# Patient Record
Sex: Female | Born: 1965 | Race: White | Hispanic: No | Marital: Married | Smoking: Current every day smoker
Health system: Southern US, Community
[De-identification: ages and names within clinical notes are randomized; demographics above are authoritative.]

## PROBLEM LIST (undated history)

## (undated) DIAGNOSIS — I10 Essential (primary) hypertension: Secondary | ICD-10-CM

## (undated) HISTORY — PX: TONSILLECTOMY: SUR1361

## (undated) HISTORY — PX: ABDOMINAL HYSTERECTOMY: SHX81

## (undated) HISTORY — PX: BREAST BIOPSY: SHX20

---

## 2003-11-26 HISTORY — PX: BREAST EXCISIONAL BIOPSY: SUR124

## 2005-05-20 ENCOUNTER — Emergency Department: Payer: Self-pay | Admitting: Emergency Medicine

## 2005-05-20 ENCOUNTER — Other Ambulatory Visit: Payer: Self-pay

## 2007-09-12 ENCOUNTER — Emergency Department: Payer: Self-pay | Admitting: Emergency Medicine

## 2009-05-25 ENCOUNTER — Emergency Department: Payer: Self-pay | Admitting: Emergency Medicine

## 2009-05-26 ENCOUNTER — Emergency Department: Payer: Self-pay | Admitting: Internal Medicine

## 2010-04-30 ENCOUNTER — Emergency Department: Payer: Self-pay | Admitting: Emergency Medicine

## 2010-05-01 ENCOUNTER — Emergency Department: Payer: Self-pay | Admitting: Emergency Medicine

## 2011-01-09 ENCOUNTER — Emergency Department: Payer: Self-pay | Admitting: Emergency Medicine

## 2013-02-22 ENCOUNTER — Ambulatory Visit: Payer: Self-pay | Admitting: Emergency Medicine

## 2013-02-22 LAB — URINALYSIS, COMPLETE
Bilirubin,UR: NEGATIVE
Glucose,UR: NEGATIVE mg/dL (ref 0–75)
Ketone: NEGATIVE
Protein: NEGATIVE
Specific Gravity: 1.02 (ref 1.003–1.030)

## 2014-01-06 ENCOUNTER — Ambulatory Visit: Payer: Self-pay

## 2014-02-10 ENCOUNTER — Ambulatory Visit: Payer: Self-pay

## 2014-02-10 LAB — RAPID INFLUENZA A&B ANTIGENS (ARMC ONLY)

## 2015-03-27 ENCOUNTER — Other Ambulatory Visit: Payer: Self-pay

## 2015-03-27 DIAGNOSIS — Z1231 Encounter for screening mammogram for malignant neoplasm of breast: Secondary | ICD-10-CM

## 2015-04-03 ENCOUNTER — Other Ambulatory Visit: Payer: Self-pay

## 2015-04-13 ENCOUNTER — Ambulatory Visit
Admission: RE | Admit: 2015-04-13 | Discharge: 2015-04-13 | Disposition: A | Discharge status: Home / Self Care | Payer: No Typology Code available for payment source | Source: Ambulatory Visit

## 2015-04-13 DIAGNOSIS — Z1231 Encounter for screening mammogram for malignant neoplasm of breast: Secondary | ICD-10-CM | POA: Insufficient documentation

## 2015-06-22 ENCOUNTER — Emergency Department
Admission: EM | Admit: 2015-06-22 | Discharge: 2015-06-22 | Disposition: A | Discharge status: Home / Self Care | Payer: No Typology Code available for payment source | Attending: Emergency Medicine | Admitting: Emergency Medicine

## 2015-06-22 ENCOUNTER — Encounter: Payer: Self-pay | Admitting: *Deleted

## 2015-06-22 DIAGNOSIS — R110 Nausea: Secondary | ICD-10-CM | POA: Diagnosis not present

## 2015-06-22 DIAGNOSIS — Z720 Tobacco use: Secondary | ICD-10-CM | POA: Insufficient documentation

## 2015-06-22 DIAGNOSIS — I10 Essential (primary) hypertension: Secondary | ICD-10-CM | POA: Diagnosis not present

## 2015-06-22 DIAGNOSIS — R197 Diarrhea, unspecified: Secondary | ICD-10-CM | POA: Insufficient documentation

## 2015-06-22 LAB — CBC
HCT: 44.7 % (ref 35.0–47.0)
HEMOGLOBIN: 15 g/dL (ref 12.0–16.0)
MCH: 30.3 pg (ref 26.0–34.0)
MCHC: 33.6 g/dL (ref 32.0–36.0)
MCV: 90.2 fL (ref 80.0–100.0)
PLATELETS: 214 10*3/uL (ref 150–440)
RBC: 4.95 MIL/uL (ref 3.80–5.20)
RDW: 13.3 % (ref 11.5–14.5)
WBC: 7.5 10*3/uL (ref 3.6–11.0)

## 2015-06-22 LAB — URINALYSIS COMPLETE WITH MICROSCOPIC (ARMC ONLY)
BACTERIA UA: NONE SEEN
BILIRUBIN URINE: NEGATIVE
Glucose, UA: NEGATIVE mg/dL
HGB URINE DIPSTICK: NEGATIVE
Ketones, ur: NEGATIVE mg/dL
Leukocytes, UA: NEGATIVE
Nitrite: NEGATIVE
PROTEIN: NEGATIVE mg/dL
SPECIFIC GRAVITY, URINE: 1.021 (ref 1.005–1.030)
pH: 5 (ref 5.0–8.0)

## 2015-06-22 LAB — COMPREHENSIVE METABOLIC PANEL
ALK PHOS: 90 U/L (ref 38–126)
ALT: 14 U/L (ref 14–54)
ANION GAP: 9 (ref 5–15)
AST: 25 U/L (ref 15–41)
Albumin: 4.4 g/dL (ref 3.5–5.0)
BUN: 18 mg/dL (ref 6–20)
CO2: 26 mmol/L (ref 22–32)
Calcium: 8.8 mg/dL — ABNORMAL LOW (ref 8.9–10.3)
Chloride: 104 mmol/L (ref 101–111)
Creatinine, Ser: 0.46 mg/dL (ref 0.44–1.00)
GFR calc Af Amer: >60 mL/min (ref >60)
Glucose, Bld: 87 mg/dL (ref 65–99)
Potassium: 3.4 mmol/L — ABNORMAL LOW (ref 3.5–5.1)
Sodium: 139 mmol/L (ref 135–145)
TOTAL PROTEIN: 7.4 g/dL (ref 6.5–8.1)
Total Bilirubin: 0.1 mg/dL — ABNORMAL LOW (ref 0.3–1.2)

## 2015-06-22 LAB — LIPASE, BLOOD: Lipase: 28 U/L (ref 22–51)

## 2015-06-22 MED ORDER — DICYCLOMINE HCL 20 MG PO TABS: 20.0000 mg | ORAL_TABLET | Freq: Three times a day (TID) | ORAL | Status: DC | PRN

## 2015-06-22 MED ORDER — ONDANSETRON 8 MG PO TBDP: 8.0000 mg | ORAL_TABLET | Freq: Three times a day (TID) | ORAL | Status: DC | PRN

## 2015-06-22 MED ORDER — AMLODIPINE BESYLATE 5 MG PO TABS: 5.0000 mg | ORAL_TABLET | Freq: Every day | ORAL | Status: DC

## 2015-06-22 NOTE — ED Notes (Signed)
Past 2 days c/o diarrhea, headache,

## 2015-06-22 NOTE — ED Provider Notes (Signed)
[facility] Emergency Department Provider Note  ____________________________________________  Time seen: 11:50 AM  I have reviewed the triage vital signs and the nursing notes.   HISTORY  Chief Complaint Diarrhea    HPI DEVANI ODONNEL is a 49 y.o. female complains of chronic headache, chronic hypertension that she is being followed by her primary care doctor for, and diarrhea that started last night. She's had 6 episodes of loose bowel movements today. She is tolerating oral intake including eating and drinking this morning. Just some nausea but no vomiting. No blood in the stool or black stool. No fevers or chills dizziness or syncope. No chest pain or shortness of breath.   She is concerned about her chronic hypertension which she seen her doctor for multiple times was always just wanted to observe without initiating any antihypertensives. She does note that today her blood pressure is much worse than it usually is.   History reviewed. No pertinent past medical history.  There are no active problems to display for this patient.   Past Surgical History  Procedure Laterality Date  . Breast biopsy Right     negative 2005  . Abdominal hysterectomy    . Tonsillectomy      Current Outpatient Rx  Name  Route  Sig  Dispense  Refill  . amLODipine (NORVASC) 5 MG tablet   Oral   Take 1 tablet (5 mg total) by mouth daily.   30 tablet   0   . dicyclomine (BENTYL) 20 MG tablet   Oral   Take 1 tablet (20 mg total) by mouth 3 (three) times daily as needed for spasms.   30 tablet   0   . ondansetron (ZOFRAN ODT) 8 MG disintegrating tablet   Oral   Take 1 tablet (8 mg total) by mouth every 8 (eight) hours as needed for nausea or vomiting.   20 tablet   0     Allergies Erythromycin and Keflex  No family history on file.  Social History History  Substance Use Topics  . Smoking status: Current Every Day Smoker  . Smokeless tobacco: Not on file  .  Alcohol Use: Yes    Review of Systems  Constitutional: No fever or chills. No weight changes Eyes:No blurry vision or double vision.  ENT: No sore throat. Cardiovascular: No chest pain. Respiratory: No dyspnea or cough. Gastrointestinal: NNo abdominal pain. Positive nausea and diarrhea. No vomiting  No BRBPR or melena. Genitourinary: Negative for dysuria, urinary retention, bloody urine, or difficulty urinating. Musculoskeletal: Negative for back pain. No joint swelling or pain. Skin: Negative for rash. Neurological: NChronic headache, unchanged. Headache for greater than 6 months, no aggravating or relieving factors.His symptoms. No numbness tingling weakness or vision change. Psychiatric:No anxiety or depression.   Endocrine:No hot/cold intolerance, changes in energy, or sleep difficulty.  10-point ROS otherwise negative.  ____________________________________________   PHYSICAL EXAM:  VITAL SIGNS: ED Triage Vitals  Enc Vitals Group     BP 06/22/15 1028 192/84 mmHg     Pulse Rate 06/22/15 1028 73     Resp 06/22/15 1028 18     Temp 06/22/15 1028 98.3 F (36.8 C)     Temp Source 06/22/15 1028 Oral     SpO2 06/22/15 1028 100 %     Weight 06/22/15 1028 130 lb (58.968 kg)     Height 06/22/15 1028  (1.651 m)     Head Cir --      Peak Flow --  Pain Score 06/22/15 1035 6     Pain Loc --      Pain Edu? --      Excl. in GC? --      Constitutional: Alert and oriented. Well appearing and in no distress. Eyes: No scleral icterus. No conjunctival pallor. PERRL. EOMI ENT   Head: Normocephalic and atraumatic.   Nose: No congestion/rhinnorhea. No septal hematoma   Mouth/Throat: MMM, no pharyngeal erythema. No peritonsillar mass. No uvula shift.   Neck: No stridor. No SubQ emphysema. No meningismus. Hematological/Lymphatic/Immunilogical: No cervical lymphadenopathy. Cardiovascular: RRR. Normal and symmetric distal pulses are present in all extremities. No  murmurs, rubs, or gallops. Respiratory: Normal respiratory effort without tachypnea nor retractions. Breath sounds are clear and equal bilaterally. No wheezes/rales/rhonchi. Gastrointestinal: Soft and nontender. No distention. There is no CVA tenderness.  No rebound, rigidity, or guarding. hyperactive bowel sounds Genitourinary: deferred Musculoskeletal: Nontender with normal range of motion in all extremities. No joint effusions.  No lower extremity tenderness.  No edema. Neurologic:   Normal speech and language.  CN 2-10 normal. Motor grossly intact. No pronator drift.  Normal gait. No gross focal neurologic deficits are appreciated.  Skin:  Skin is warm, dry and intact. No rash noted.  No petechiae, purpura, or bullae. Psychiatric: Mood and affect are normal. Speech and behavior are normal. Patient exhibits appropriate insight and judgment.  ____________________________________________    LABS (pertinent positives/negatives) (all labs ordered are listed, but only abnormal results are displayed) Labs Reviewed  COMPREHENSIVE METABOLIC PANEL - Abnormal; Notable for the following:    Potassium 3.4 (*)    Calcium 8.8 (*)    Total Bilirubin 0.1 (*)    All other components within normal limits  URINALYSIS COMPLETEWITH MICROSCOPIC ([facility] ONLY) - Abnormal; Notable for the following:    Color, Urine YELLOW (*)    APPearance CLEAR (*)    Squamous Epithelial / LPF 0-5 (*)    All other components within normal limits  LIPASE, BLOOD  CBC   ____________________________________________   EKG    ____________________________________________    RADIOLOGY    ____________________________________________   PROCEDURES  ____________________________________________   INITIAL IMPRESSION / ASSESSMENT AND PLAN / ED COURSE  Pertinent labs & imaging results that were available during my care of the patient were reviewed by me and considered in my medical decision making (see chart for  details).  Patient well appearing no acute distress. Complains of 12 hours of diarrhea, chronic headache, and concern about her blood pressure. Due to the marked elevation of blood pressure today we'll go ahead and start her on an antihypertensive. She artery seems a little bit dehydrated with the diarrhea and concentrated urine, we will not use HCTZ which is normally first-line And instead go to amlodipine. Patient is encouraged to follow up with her primary care doctor in 1 week for recheck a blood pressure and reassessment of antihypertensives.  ____________________________________________   FINAL CLINICAL IMPRESSION(S) / ED DIAGNOSES  Final diagnoses:  Essential hypertension  Diarrhea      Sharman Cheek, MD 06/22/15 [phone]

## 2015-06-22 NOTE — Discharge Instructions (Signed)
Diarrhea Diarrhea is frequent loose and watery bowel movements. It can cause you to feel weak and dehydrated. Dehydration can cause you to become tired and thirsty, have a dry mouth, and have decreased urination that often is dark yellow. Diarrhea is a sign of another problem, most often an infection that will not last long. In most cases, diarrhea typically lasts 2-3 days. However, it can last longer if it is a sign of something more serious. It is important to treat your diarrhea as directed by your caregiver to lessen or prevent future episodes of diarrhea. CAUSES  Some common causes include:  Gastrointestinal infections caused by viruses, bacteria, or parasites.  Food poisoning or food allergies.  Certain medicines, such as antibiotics, chemotherapy, and laxatives.  Artificial sweeteners and fructose.  Digestive disorders. HOME CARE INSTRUCTIONS  Ensure adequate fluid intake (hydration): Have 1 cup (8 oz) of fluid for each diarrhea episode. Avoid fluids that contain simple sugars or sports drinks, fruit juices, whole milk products, and sodas. Your urine should be clear or pale yellow if you are drinking enough fluids. Hydrate with an oral rehydration solution that you can purchase at pharmacies, retail stores, and online. You can prepare an oral rehydration solution at home by mixing the following ingredients together:   - tsp table salt.   tsp baking soda.   tsp salt substitute containing potassium chloride.  1  tablespoons sugar.  1 L (34 oz) of water.  Certain foods and beverages may increase the speed at which food moves through the gastrointestinal (GI) tract. These foods and beverages should be avoided and include:  Caffeinated and alcoholic beverages.  High-fiber foods, such as raw fruits and vegetables, nuts, seeds, and whole grain breads and cereals.  Foods and beverages sweetened with sugar alcohols, such as xylitol, sorbitol, and mannitol.  Some foods may be well  tolerated and may help thicken stool including:  Starchy foods, such as rice, toast, pasta, low-sugar cereal, oatmeal, grits, baked potatoes, crackers, and bagels.  Bananas.  Applesauce.  Add probiotic-rich foods to help increase healthy bacteria in the GI tract, such as yogurt and fermented milk products.  Wash your hands well after each diarrhea episode.  Only take over-the-counter or prescription medicines as directed by your caregiver.  Take a warm bath to relieve any burning or pain from frequent diarrhea episodes. SEEK IMMEDIATE MEDICAL CARE IF:   You are unable to keep fluids down.  You have persistent vomiting.  You have blood in your stool, or your stools are black and tarry.  You do not urinate in 6-8 hours, or there is only a small amount of very dark urine.  You have abdominal pain that increases or localizes.  You have weakness, dizziness, confusion, or light-headedness.  You have a severe headache.  Your diarrhea gets worse or does not get better.  You have a fever or persistent symptoms for more than 2-3 days.  You have a fever and your symptoms suddenly get worse. MAKE SURE YOU:   Understand these instructions.  Will watch your condition.  Will get help right away if you are not doing well or get worse. Document Released: 11/01/2002 Document Revised: 03/28/2014 Document Reviewed: 07/19/2012 [facility] Patient Information 2015 [place], [place]. This information is not intended to replace advice given to you by your health care provider. Make sure you discuss any questions you have with your health care provider.  Hypertension Hypertension, commonly called high blood pressure, is when the force of blood pumping through your  arteries is too strong. Your arteries are the blood vessels that carry blood from your heart throughout your body. A blood pressure reading consists of a higher number over a lower number, such as 110/72. The higher number (systolic) is  the pressure inside your arteries when your heart pumps. The lower number (diastolic) is the pressure inside your arteries when your heart relaxes. Ideally you want your blood pressure below 120/80. Hypertension forces your heart to work harder to pump blood. Your arteries may become narrow or stiff. Having hypertension puts you at risk for heart disease, stroke, and other problems.  RISK FACTORS Some risk factors for high blood pressure are controllable. Others are not.  Risk factors you cannot control include:   Race. You may be at higher risk if you are African American.  Age. Risk increases with age.  Gender. Men are at higher risk than women before age 42 years. After age 25, women are at higher risk than men. Risk factors you can control include:  Not getting enough exercise or physical activity.  Being overweight.  Getting too much fat, sugar, calories, or salt in your diet.  Drinking too much alcohol. SIGNS AND SYMPTOMS Hypertension does not usually cause signs or symptoms. Extremely high blood pressure (hypertensive crisis) may cause headache, anxiety, shortness of breath, and nosebleed. DIAGNOSIS  To check if you have hypertension, your health care provider will measure your blood pressure while you are seated, with your arm held at the level of your heart. It should be measured at least twice using the same arm. Certain conditions can cause a difference in blood pressure between your right and left arms. A blood pressure reading that is higher than normal on one occasion does not mean that you need treatment. If one blood pressure reading is high, ask your health care provider about having it checked again. TREATMENT  Treating high blood pressure includes making lifestyle changes and possibly taking medicine. Living a healthy lifestyle can help lower high blood pressure. You may need to change some of your habits. Lifestyle changes may include:  Following the DASH diet. This  diet is high in fruits, vegetables, and whole grains. It is low in salt, red meat, and added sugars.  Getting at least 2 hours of brisk physical activity every week.  Losing weight if necessary.  Not smoking.  Limiting alcoholic beverages.  Learning ways to reduce stress. If lifestyle changes are not enough to get your blood pressure under control, your health care provider may prescribe medicine. You may need to take more than one. Work closely with your health care provider to understand the risks and benefits. HOME CARE INSTRUCTIONS  Have your blood pressure rechecked as directed by your health care provider.   Take medicines only as directed by your health care provider. Follow the directions carefully. Blood pressure medicines must be taken as prescribed. The medicine does not work as well when you skip doses. Skipping doses also puts you at risk for problems.   Do not smoke.   Monitor your blood pressure at home as directed by your health care provider. SEEK MEDICAL CARE IF:   You think you are having a reaction to medicines taken.  You have recurrent headaches or feel dizzy.  You have swelling in your ankles.  You have trouble with your vision. SEEK IMMEDIATE MEDICAL CARE IF:  You develop a severe headache or confusion.  You have unusual weakness, numbness, or feel faint.  You have severe chest or  abdominal pain.  You vomit repeatedly.  You have trouble breathing. MAKE SURE YOU:   Understand these instructions.  Will watch your condition.  Will get help right away if you are not doing well or get worse. Document Released: 11/11/2005 Document Revised: 03/28/2014 Document Reviewed: 09/03/2013 [facility] Patient Information 2015 [place], [place]. This information is not intended to replace advice given to you by your health care provider. Make sure you discuss any questions you have with your health care provider.

## 2016-02-06 ENCOUNTER — Emergency Department
Admission: EM | Admit: 2016-02-06 | Discharge: 2016-02-06 | Disposition: A | Discharge status: Home / Self Care | Payer: No Typology Code available for payment source | Attending: Emergency Medicine | Admitting: Emergency Medicine

## 2016-02-06 ENCOUNTER — Emergency Department: Payer: No Typology Code available for payment source

## 2016-02-06 ENCOUNTER — Encounter: Payer: Self-pay | Admitting: Emergency Medicine

## 2016-02-06 DIAGNOSIS — W108XXA Fall (on) (from) other stairs and steps, initial encounter: Secondary | ICD-10-CM | POA: Insufficient documentation

## 2016-02-06 DIAGNOSIS — Y9289 Other specified places as the place of occurrence of the external cause: Secondary | ICD-10-CM | POA: Diagnosis not present

## 2016-02-06 DIAGNOSIS — Y9389 Activity, other specified: Secondary | ICD-10-CM | POA: Diagnosis not present

## 2016-02-06 DIAGNOSIS — S8991XA Unspecified injury of right lower leg, initial encounter: Secondary | ICD-10-CM | POA: Insufficient documentation

## 2016-02-06 DIAGNOSIS — Y998 Other external cause status: Secondary | ICD-10-CM | POA: Diagnosis not present

## 2016-02-06 DIAGNOSIS — F17200 Nicotine dependence, unspecified, uncomplicated: Secondary | ICD-10-CM | POA: Diagnosis not present

## 2016-02-06 DIAGNOSIS — S79911A Unspecified injury of right hip, initial encounter: Secondary | ICD-10-CM | POA: Insufficient documentation

## 2016-02-06 DIAGNOSIS — S8011XA Contusion of right lower leg, initial encounter: Secondary | ICD-10-CM

## 2016-02-06 DIAGNOSIS — S7011XA Contusion of right thigh, initial encounter: Secondary | ICD-10-CM | POA: Insufficient documentation

## 2016-02-06 DIAGNOSIS — S79921A Unspecified injury of right thigh, initial encounter: Secondary | ICD-10-CM | POA: Diagnosis present

## 2016-02-06 DIAGNOSIS — Z79899 Other long term (current) drug therapy: Secondary | ICD-10-CM | POA: Insufficient documentation

## 2016-02-06 MED ORDER — HYDROCODONE-ACETAMINOPHEN 5-325 MG PO TABS
1.0000 | ORAL_TABLET | Freq: Once | ORAL | Status: AC
  Administered 2016-02-06: 1 via ORAL
  Filled 2016-02-06: qty 1

## 2016-02-06 MED ORDER — HYDROCODONE-ACETAMINOPHEN 5-325 MG PO TABS: 1.0000 | ORAL_TABLET | ORAL | Status: DC | PRN

## 2016-02-06 MED ORDER — IBUPROFEN 600 MG PO TABS: 600.0000 mg | ORAL_TABLET | Freq: Three times a day (TID) | ORAL | Status: DC | PRN

## 2016-02-06 NOTE — Discharge Instructions (Signed)
Contusion °A contusion is a deep bruise. Contusions happen when an injury causes bleeding under the skin. Symptoms of bruising include pain, swelling, and discolored skin. The skin may turn blue, purple, or yellow. °HOME CARE  °· Rest the injured area. °· If told, put ice on the injured area. °· Put ice in a plastic bag. °· Place a towel between your skin and the bag. °· Leave the ice on for 20 minutes, 2-3 times per day. °· If told, put light pressure (compression) on the injured area using an elastic bandage. Make sure the bandage is not too tight. Remove it and put it back on as told by your doctor. °· If possible, raise (elevate) the injured area above the level of your heart while you are sitting or lying down. °· Take over-the-counter and prescription medicines only as told by your doctor. °GET HELP IF: °· Your symptoms do not get better after several days of treatment. °· Your symptoms get worse. °· You have trouble moving the injured area. °GET HELP RIGHT AWAY IF:  °· You have very bad pain. °· You have a loss of feeling (numbness) in a hand or foot. °· Your hand or foot turns pale or cold. °  °This information is not intended to replace advice given to you by your health care provider. Make sure you discuss any questions you have with your health care provider. °  °Document Released: 04/29/2008 Document Revised: 08/02/2015 Document Reviewed: 03/29/2015 °Elsevier Interactive Patient Education ©2016 [employer] ° °Cryotherapy °Cryotherapy is when you put ice on your injury. Ice helps lessen pain and puffiness (swelling) after an injury. Ice works the best when you start using it in the first 24 to 48 hours after an injury. °HOME CARE °· Put a dry or damp towel between the ice pack and your skin. °· You may press gently on the ice pack. °· Leave the ice on for no more than 10 to 20 minutes at a time. °· Check your skin after 5 minutes to make sure your skin is okay. °· Rest at least 20 minutes between ice  pack uses. °· Stop using ice when your skin loses feeling (numbness). °· Do not use ice on someone who cannot tell you when it hurts. This includes small children and people with memory problems (dementia). °GET HELP RIGHT AWAY IF: °· You have white spots on your skin. °· Your skin turns blue or pale. °· Your skin feels waxy or hard. °· Your puffiness gets worse. °MAKE SURE YOU:  °· Understand these instructions. °· Will watch your condition. °· Will get help right away if you are not doing well or get worse. °  °This information is not intended to replace advice given to you by your health care provider. Make sure you discuss any questions you have with your health care provider. °  °Document Released: 04/29/2008 Document Revised: 02/03/2012 Document Reviewed: 07/04/2011 °Elsevier Interactive Patient Education ©2016 [employer] ° °

## 2016-02-06 NOTE — ED Notes (Signed)
Larey SeatFell this am onto right knee and it hurts from hip to knee.

## 2016-02-06 NOTE — ED Provider Notes (Signed)
[facility] Emergency Department Provider Note  ____________________________________________  Time seen: Approximately 8:20 AM  I have reviewed the triage vital signs and the nursing notes.   HISTORY  Chief Complaint Fall and Leg Injury   HPI Alexandria Larson is a 50 y.o. female is here complaining of right upper leg pain. Patient states that at approximate 640 she was going down some steps to go to work and fail. She states that she initially struck her right knee directly onto the ground but now is having right hip pain as well. She was able to bear some weight but this causes extreme pain. She denies any head injury or loss of consciousness during this time. She is not taking any over-the-counter medication prior to arrival. She rates her pain as a 6/10.   History reviewed. No pertinent past medical history.  There are no active problems to display for this patient.   Past Surgical History  Procedure Laterality Date  . Breast biopsy Right     negative 2005  . Abdominal hysterectomy    . Tonsillectomy      Current Outpatient Rx  Name  Route  Sig  Dispense  Refill  . amLODipine (NORVASC) 5 MG tablet   Oral   Take 1 tablet (5 mg total) by mouth daily.   30 tablet   0   . dicyclomine (BENTYL) 20 MG tablet   Oral   Take 1 tablet (20 mg total) by mouth 3 (three) times daily as needed for spasms.   30 tablet   0   . HYDROcodone-acetaminophen (NORCO/VICODIN) 5-325 MG tablet   Oral   Take 1 tablet by mouth every 4 (four) hours as needed for moderate pain.   20 tablet   0   . ibuprofen (ADVIL,MOTRIN) 600 MG tablet   Oral   Take 1 tablet (600 mg total) by mouth every 8 (eight) hours as needed.   30 tablet   0   . ondansetron (ZOFRAN ODT) 8 MG disintegrating tablet   Oral   Take 1 tablet (8 mg total) by mouth every 8 (eight) hours as needed for nausea or vomiting.   20 tablet   0     Allergies Erythromycin and Keflex  No family history  on file.  Social History Social History  Substance Use Topics  . Smoking status: Current Every Day Smoker  . Smokeless tobacco: None  . Alcohol Use: Yes    Review of Systems Constitutional: No fever/chills Eyes: No visual changes. ENT: No trauma Cardiovascular: Denies chest pain. Respiratory: Denies shortness of breath. Gastrointestinal: No abdominal pain.  No nausea, no vomiting.   Musculoskeletal: Positive for right upper leg pain. Skin: Negative for rash. Neurological: Negative for headaches, focal weakness or numbness.  10-point ROS otherwise negative.  ____________________________________________   PHYSICAL EXAM:  VITAL SIGNS: ED Triage Vitals  Enc Vitals Group     BP 02/06/16 0756 106/65 mmHg     Pulse Rate 02/06/16 0756 88     Resp 02/06/16 0756 18     Temp 02/06/16 0756 98.6 F (37 C)     Temp Source 02/06/16 0756 Oral     SpO2 02/06/16 0756 99 %     Weight 02/06/16 0756 118 lb (53.524 kg)     Height 02/06/16 0756  (1.651 m)     Head Cir --      Peak Flow --      Pain Score 02/06/16 0750 6  Pain Loc --      Pain Edu? --      Excl. in GC? --     Constitutional: Alert and oriented. Well appearing and in no acute distress. Eyes: Conjunctivae are normal. PERRL. EOMI. Head: Atraumatic. Nose: No congestion/rhinnorhea. Neck: No stridor.  No cervical tenderness on palpation posteriorly. Cardiovascular: Normal rate, regular rhythm. Grossly normal heart sounds.  Good peripheral circulation. Respiratory: Normal respiratory effort.  No retractions. Lungs CTAB. Gastrointestinal: Soft and nontender. No distention.  Musculoskeletal: Right leg was deformity is noted. There is no effusion or soft tissue swelling noted on the right anterior knee. There is moderate tenderness on palpation of the anterior mid femur area without deformity, soft tissue swelling or ecchymosis. Range of motion right hip is decreased secondary to discomfort, no deformity was noted and  no ecchymosis or abrasions. Neurologic:  Normal speech and language. No gross focal neurologic deficits are appreciated. No gait instability. Skin:  Skin is warm, dry and intact. No abrasions, ecchymosis, or erythema was noted. Psychiatric: Mood and affect are normal. Speech and behavior are normal.  ____________________________________________   LABS (all labs ordered are listed, but only abnormal results are displayed)  Labs Reviewed - No data to display  RADIOLOGY  Right femur no acute abnormality noted ____________________________________________   PROCEDURES  Procedure(s) performed: None  Critical Care performed: No  ____________________________________________   INITIAL IMPRESSION / ASSESSMENT AND PLAN / ED COURSE  Pertinent labs & imaging results that were available during my care of the patient were reviewed by me and considered in my medical decision making (see chart for details).  Patient was reassured that there is no fracture. Patient was discharged with ibuprofen 600 mg 3 times a day along with Norco as needed for severe pain. She is also given a set of crutches to use for weightbearing. She is also given a note for work and told to not take pain medication while driving. She is to follow-up with Dr. Gray BernhardtKaczynski if any continued problems. ____________________________________________   FINAL CLINICAL IMPRESSION(S) / ED DIAGNOSES  Final diagnoses:  Contusion of right leg, initial encounter      Tommi RumpsRhonda L Yukie Bergeron, PA-C 02/06/16 1015  Sharyn CreamerMark Quale, MD 02/06/16 1501

## 2016-02-06 NOTE — ED Notes (Signed)
Pt to ed with c/o right upper leg pain after falling down 2 steps at 640am today.  Pt reports pain in right foot and ankle and right hip and right upper leg.   Pt states weight bearing causes extreme pain.

## 2016-03-04 ENCOUNTER — Other Ambulatory Visit: Payer: Self-pay | Admitting: Preventative Medicine

## 2016-03-04 DIAGNOSIS — Z1231 Encounter for screening mammogram for malignant neoplasm of breast: Secondary | ICD-10-CM

## 2016-04-15 ENCOUNTER — Ambulatory Visit
Admission: RE | Admit: 2016-04-15 | Discharge: 2016-04-15 | Disposition: A | Discharge status: Home / Self Care | Payer: No Typology Code available for payment source | Source: Ambulatory Visit | Attending: Preventative Medicine | Admitting: Preventative Medicine

## 2016-04-15 DIAGNOSIS — Z1231 Encounter for screening mammogram for malignant neoplasm of breast: Secondary | ICD-10-CM | POA: Diagnosis present

## 2017-03-12 ENCOUNTER — Other Ambulatory Visit: Payer: Self-pay | Admitting: Family Medicine

## 2017-03-12 DIAGNOSIS — Z1231 Encounter for screening mammogram for malignant neoplasm of breast: Secondary | ICD-10-CM

## 2017-04-16 ENCOUNTER — Ambulatory Visit
Admission: RE | Admit: 2017-04-16 | Discharge: 2017-04-16 | Disposition: A | Discharge status: Home / Self Care | Payer: Managed Care, Other (non HMO) | Source: Ambulatory Visit | Attending: Family Medicine | Admitting: Family Medicine

## 2017-04-16 DIAGNOSIS — Z1231 Encounter for screening mammogram for malignant neoplasm of breast: Secondary | ICD-10-CM | POA: Insufficient documentation

## 2017-11-07 ENCOUNTER — Emergency Department: Payer: PRIVATE HEALTH INSURANCE

## 2017-11-07 ENCOUNTER — Emergency Department
Admission: EM | Admit: 2017-11-07 | Discharge: 2017-11-07 | Disposition: A | Discharge status: Home / Self Care | Payer: PRIVATE HEALTH INSURANCE | Attending: Student in an Organized Health Care Education/Training Program | Admitting: Student in an Organized Health Care Education/Training Program

## 2017-11-07 ENCOUNTER — Encounter: Payer: Self-pay | Admitting: Emergency Medicine

## 2017-11-07 ENCOUNTER — Other Ambulatory Visit: Payer: Self-pay

## 2017-11-07 DIAGNOSIS — R0789 Other chest pain: Secondary | ICD-10-CM | POA: Insufficient documentation

## 2017-11-07 DIAGNOSIS — F17200 Nicotine dependence, unspecified, uncomplicated: Secondary | ICD-10-CM | POA: Diagnosis not present

## 2017-11-07 DIAGNOSIS — I10 Essential (primary) hypertension: Secondary | ICD-10-CM | POA: Insufficient documentation

## 2017-11-07 DIAGNOSIS — R079 Chest pain, unspecified: Secondary | ICD-10-CM | POA: Diagnosis present

## 2017-11-07 HISTORY — DX: Essential (primary) hypertension: I10

## 2017-11-07 LAB — BASIC METABOLIC PANEL
Anion gap: 10 (ref 5–15)
BUN: 22 mg/dL — AB (ref 6–20)
CO2: 26 mmol/L (ref 22–32)
CREATININE: 0.56 mg/dL (ref 0.44–1.00)
Calcium: 8.9 mg/dL (ref 8.9–10.3)
Chloride: 101 mmol/L (ref 101–111)
Glucose, Bld: 88 mg/dL (ref 65–99)
Potassium: 3.9 mmol/L (ref 3.5–5.1)
SODIUM: 137 mmol/L (ref 135–145)

## 2017-11-07 LAB — CBC WITH DIFFERENTIAL/PLATELET
BASOS PCT: 1 %
Basophils Absolute: 0.1 10*3/uL (ref 0–0.1)
EOS PCT: 2 %
Eosinophils Absolute: 0.2 10*3/uL (ref 0–0.7)
HCT: 42.3 % (ref 35.0–47.0)
Hemoglobin: 14.5 g/dL (ref 12.0–16.0)
LYMPHS ABS: 1.7 10*3/uL (ref 1.0–3.6)
Lymphocytes Relative: 23 %
MCH: 31 pg (ref 26.0–34.0)
MCHC: 34.4 g/dL (ref 32.0–36.0)
MCV: 90.2 fL (ref 80.0–100.0)
Monocytes Absolute: 0.5 10*3/uL (ref 0.2–0.9)
Monocytes Relative: 6 %
Neutro Abs: 5.3 10*3/uL (ref 1.4–6.5)
Neutrophils Relative %: 68 %
PLATELETS: 293 10*3/uL (ref 150–440)
RBC: 4.69 MIL/uL (ref 3.80–5.20)
RDW: 13.1 % (ref 11.5–14.5)
WBC: 7.7 10*3/uL (ref 3.6–11.0)

## 2017-11-07 LAB — TROPONIN I

## 2017-11-07 LAB — FIBRIN DERIVATIVES D-DIMER (ARMC ONLY): Fibrin derivatives D-dimer (ARMC): 273.69 ng/mL (FEU) (ref 0.00–499.00)

## 2017-11-07 MED ORDER — NAPROXEN 500 MG PO TABS: 500.0000 mg | ORAL_TABLET | Freq: Two times a day (BID) | ORAL | 0 refills | Status: AC

## 2017-11-07 MED ORDER — OXYCODONE-ACETAMINOPHEN 5-325 MG PO TABS
1.0000 | ORAL_TABLET | Freq: Once | ORAL | Status: AC
  Administered 2017-11-07: 1 via ORAL
  Filled 2017-11-07: qty 1

## 2017-11-07 MED ORDER — NAPROXEN 500 MG PO TABS
500.0000 mg | ORAL_TABLET | Freq: Once | ORAL | Status: AC
  Administered 2017-11-07: 500 mg via ORAL
  Filled 2017-11-07: qty 1

## 2017-11-07 MED ORDER — TRAMADOL HCL 50 MG PO TABS
50.0000 mg | ORAL_TABLET | Freq: Four times a day (QID) | ORAL | 0 refills | Status: AC | PRN
Start: 2017-11-07 — End: 2018-11-07

## 2017-11-07 NOTE — ED Triage Notes (Signed)
Arrives with c/o mid sternal chest pain x 1 day. Patient describes pain "like someone punched me in the sternum".  Pain worse with deep breathing and movement.  States pain worsened overnight.

## 2017-11-07 NOTE — ED Notes (Signed)
Patient transported to X-ray 

## 2017-11-07 NOTE — ED Provider Notes (Signed)
[facility] Emergency Department Provider Note    First MD Initiated Contact with Patient 11/07/17 0805     (approximate)  I have reviewed the triage vital signs and the nursing notes.   HISTORY  Chief Complaint Chest Pain    HPI Alexandria Larson is a 51 y.o. female with a history of hypertension presents with chief complaint of roughly 16 hours of midsternal chest pain.  Pain started last night around 4:00 while the patient was smoking a cigarette.  States it is worsened overnight patient had difficulty sleeping due to the pain.  Denies any nausea or vomiting.  No diaphoresis.  Pain is worsened with movement and palpation of the chest wall.  Has never had pain like this before.  She is on Premarin but denies any personal history of blood clots.  Pain is worse with taking a deep breath.  No fevers or productive cough.  No lower extremity swelling or orthopnea.  Past Medical History:  Diagnosis Date  . Hypertension    No family history on file. Past Surgical History:  Procedure Laterality Date  . ABDOMINAL HYSTERECTOMY    . BREAST BIOPSY Right    negative 2005  . TONSILLECTOMY     There are no active problems to display for this patient.     Prior to Admission medications   Medication Sig Start Date End Date Taking? Authorizing Provider  amLODipine (NORVASC) 5 MG tablet Take 1 tablet (5 mg total) by mouth daily. 06/22/15 06/21/16  Sharman Cheek, MD  dicyclomine (BENTYL) 20 MG tablet Take 1 tablet (20 mg total) by mouth 3 (three) times daily as needed for spasms. 06/22/15   Sharman Cheek, MD  HYDROcodone-acetaminophen (NORCO/VICODIN) 5-325 MG tablet Take 1 tablet by mouth every 4 (four) hours as needed for moderate pain. 02/06/16   Tommi Rumps, PA-C  ibuprofen (ADVIL,MOTRIN) 600 MG tablet Take 1 tablet (600 mg total) by mouth every 8 (eight) hours as needed. 02/06/16   Tommi Rumps, PA-C  naproxen (NAPROSYN) 500 MG tablet Take 1 tablet (500  mg total) by mouth 2 (two) times daily with a meal. 11/07/17 11/07/18  Willy Eddy, MD  ondansetron (ZOFRAN ODT) 8 MG disintegrating tablet Take 1 tablet (8 mg total) by mouth every 8 (eight) hours as needed for nausea or vomiting. 06/22/15   Sharman Cheek, MD  traMADol (ULTRAM) 50 MG tablet Take 1 tablet (50 mg total) by mouth every 6 (six) hours as needed. 11/07/17 11/07/18  Willy Eddy, MD    Allergies Erythromycin and Keflex [cephalexin]    Social History Social History   Tobacco Use  . Smoking status: Current Every Day Smoker  . Smokeless tobacco: Never Used  Substance Use Topics  . Alcohol use: Yes  . Drug use: Not on file    Review of Systems Patient denies headaches, rhinorrhea, blurry vision, numbness, shortness of breath, chest pain, edema, cough, abdominal pain, nausea, vomiting, diarrhea, dysuria, fevers, rashes or hallucinations unless otherwise stated above in HPI. ____________________________________________   PHYSICAL EXAM:  VITAL SIGNS: Vitals:   11/07/17 0834 11/07/17 0949  BP: 110/66 110/82  Pulse: 72   Temp:  98.2 F (36.8 C)  SpO2: 100% 100%    Constitutional: Alert and oriented. Well appearing and in no acute distress. Eyes: Conjunctivae are normal.  Head: Atraumatic. Nose: No congestion/rhinnorhea. Mouth/Throat: Mucous membranes are moist.   Neck: No stridor. Painless ROM.  Cardiovascular: Normal rate, regular rhythm. Grossly normal heart sounds.  Good peripheral  circulation. Respiratory: Normal respiratory effort.  No retractions. Lungs CTAB. Gastrointestinal: Soft and nontender. No distention. No abdominal bruits. No CVA tenderness. Genitourinary:  Musculoskeletal: Chest pain is reproducible with palpation of the sternum.  No lower extremity tenderness nor edema.  No joint effusions. Neurologic:  Normal speech and language. No gross focal neurologic deficits are appreciated. No facial droop Skin:  Skin is warm, dry and intact.  No rash noted. Psychiatric: Mood and affect are normal. Speech and behavior are normal.  ____________________________________________   LABS (all labs ordered are listed, but only abnormal results are displayed)  Results for orders placed or performed during the hospital encounter of 11/07/17 (from the past 24 hour(s))  CBC with Differential/Platelet     Status: None   Collection Time: 11/07/17  8:08 AM  Result Value Ref Range   WBC 7.7 3.6 - 11.0 K/uL   RBC 4.69 3.80 - 5.20 MIL/uL   Hemoglobin 14.5 12.0 - 16.0 g/dL   HCT 16.142.3 09.635.0 - 04.547.0 %   MCV 90.2 80.0 - 100.0 fL   MCH 31.0 26.0 - 34.0 pg   MCHC 34.4 32.0 - 36.0 g/dL   RDW 40.913.1 81.111.5 - 91.414.5 %   Platelets 293 150 - 440 K/uL   Neutrophils Relative % 68 %   Neutro Abs 5.3 1.4 - 6.5 K/uL   Lymphocytes Relative 23 %   Lymphs Abs 1.7 1.0 - 3.6 K/uL   Monocytes Relative 6 %   Monocytes Absolute 0.5 0.2 - 0.9 K/uL   Eosinophils Relative 2 %   Eosinophils Absolute 0.2 0 - 0.7 K/uL   Basophils Relative 1 %   Basophils Absolute 0.1 0 - 0.1 K/uL  Basic metabolic panel     Status: Abnormal   Collection Time: 11/07/17  8:08 AM  Result Value Ref Range   Sodium 137 135 - 145 mmol/L   Potassium 3.9 3.5 - 5.1 mmol/L   Chloride 101 101 - 111 mmol/L   CO2 26 22 - 32 mmol/L   Glucose, Bld 88 65 - 99 mg/dL   BUN 22 (H) 6 - 20 mg/dL   Creatinine, Ser 7.820.56 0.44 - 1.00 mg/dL   Calcium 8.9 8.9 - 95.610.3 mg/dL   GFR calc non Af Amer >60 >60 mL/min   GFR calc Af Amer >60 >60 mL/min   Anion gap 10 5 - 15  Troponin I     Status: None   Collection Time: 11/07/17  8:08 AM  Result Value Ref Range   Troponin I <0.03 <0.03 ng/mL  Fibrin derivatives D-Dimer ([facility] only)     Status: None   Collection Time: 11/07/17  8:15 AM  Result Value Ref Range   Fibrin derivatives D-dimer ([facility]) 273.69 0.00 - 499.00 ng/mL (FEU)   ____________________________________________  EKG My review and personal interpretation at Time: 8:00   Indication: chest pain   Rate: 85  Rhythm: sinus Axis: normal Other: normal intervals, no stemi ____________________________________________  RADIOLOGY  I personally reviewed all radiographic images ordered to evaluate for the above acute complaints and reviewed radiology reports and findings.  These findings were personally discussed with the patient.  Please see medical record for radiology report.  ____________________________________________   PROCEDURES  Procedure(s) performed:  Procedures    Critical Care performed: no ____________________________________________   INITIAL IMPRESSION / ASSESSMENT AND PLAN / ED COURSE  Pertinent labs & imaging results that were available during my care of the patient were reviewed by me and considered in my medical decision making (  see chart for details).  DDX: ACS, pericarditis, bronchitis, costochondritis, pna, Pe, dissection   Eddie NorthWendy T Roupp is a 51 y.o. who presents to the ED with anterior chest wall pain as described above.  EKG shows no evidence of ischemia.  Heart score of 2.  We will send her troponin to further risk stratify.  Patient is low risk Wells but as she is on Coumadin therapy and describes pleuritic chest pain will order d-dimer to further stratify for pulmonary embolism.  Will provide pain medication.  The patient will be placed on continuous pulse oximetry and telemetry for monitoring.  Laboratory evaluation will be sent to evaluate for the above complaints.       ----------------------------------------- 9:43 AM on 11/07/2017 -----------------------------------------  Patient reassessed.  Remains Heema dynamically stable and in no acute distress.  D-dimer is negative.  This seems most clinically consistent with musculoskeletal chest pain.  Will discharge with pain medication and anti-inflammatories with follow-up with cardiology and PCP.  Discussed signs and symptoms for which she should return immediately to the  hospital.  ____________________________________________   FINAL CLINICAL IMPRESSION(S) / ED DIAGNOSES  Final diagnoses:  Chest wall pain      NEW MEDICATIONS STARTED DURING THIS VISIT:  This SmartLink is deprecated. Use AVSMEDLIST instead to display the medication list for a patient.   Note:  This document was prepared using Dragon voice recognition software and may include unintentional dictation errors.    Willy Eddyobinson, Sherell Christoffel, MD 11/07/17 1000

## 2017-11-07 NOTE — ED Notes (Signed)
Patient continues to complain of mid sternal chest pain. MD made aware. See new orders.

## 2017-12-17 ENCOUNTER — Other Ambulatory Visit: Payer: Self-pay | Admitting: Family Medicine

## 2017-12-17 DIAGNOSIS — Z1231 Encounter for screening mammogram for malignant neoplasm of breast: Secondary | ICD-10-CM

## 2019-02-04 ENCOUNTER — Emergency Department
Admission: EM | Admit: 2019-02-04 | Discharge: 2019-02-04 | Disposition: A | Discharge status: Home / Self Care | Payer: 59 | Attending: Emergency Medicine | Admitting: Emergency Medicine

## 2019-02-04 ENCOUNTER — Emergency Department: Payer: 59

## 2019-02-04 ENCOUNTER — Other Ambulatory Visit: Payer: Self-pay

## 2019-02-04 DIAGNOSIS — F17200 Nicotine dependence, unspecified, uncomplicated: Secondary | ICD-10-CM | POA: Diagnosis not present

## 2019-02-04 DIAGNOSIS — R609 Edema, unspecified: Secondary | ICD-10-CM

## 2019-02-04 DIAGNOSIS — R600 Localized edema: Secondary | ICD-10-CM | POA: Insufficient documentation

## 2019-02-04 DIAGNOSIS — I10 Essential (primary) hypertension: Secondary | ICD-10-CM | POA: Diagnosis not present

## 2019-02-04 DIAGNOSIS — R2243 Localized swelling, mass and lump, lower limb, bilateral: Secondary | ICD-10-CM | POA: Diagnosis present

## 2019-02-04 LAB — URINALYSIS, COMPLETE (UACMP) WITH MICROSCOPIC
BILIRUBIN URINE: NEGATIVE
Bacteria, UA: NONE SEEN
Glucose, UA: NEGATIVE mg/dL
Hgb urine dipstick: NEGATIVE
Ketones, ur: NEGATIVE mg/dL
Leukocytes,Ua: NEGATIVE
Nitrite: NEGATIVE
Protein, ur: NEGATIVE mg/dL
Specific Gravity, Urine: 1.021 (ref 1.005–1.030)
pH: 5 (ref 5.0–8.0)

## 2019-02-04 LAB — CBC
HCT: 42.1 % (ref 36.0–46.0)
Hemoglobin: 13.6 g/dL (ref 12.0–15.0)
MCH: 29.5 pg (ref 26.0–34.0)
MCHC: 32.3 g/dL (ref 30.0–36.0)
MCV: 91.3 fL (ref 80.0–100.0)
Platelets: 326 10*3/uL (ref 150–400)
RBC: 4.61 MIL/uL (ref 3.87–5.11)
RDW: 13 % (ref 11.5–15.5)
WBC: 9 10*3/uL (ref 4.0–10.5)
nRBC: 0 % (ref 0.0–0.2)

## 2019-02-04 LAB — BASIC METABOLIC PANEL
ANION GAP: 11 (ref 5–15)
BUN: 25 mg/dL — ABNORMAL HIGH (ref 6–20)
CO2: 25 mmol/L (ref 22–32)
Calcium: 9.2 mg/dL (ref 8.9–10.3)
Chloride: 102 mmol/L (ref 98–111)
Creatinine, Ser: 0.58 mg/dL (ref 0.44–1.00)
GFR calc Af Amer: >60 mL/min (ref >60)
GFR calc non Af Amer: >60 mL/min (ref >60)
GLUCOSE: 106 mg/dL — AB (ref 70–99)
Potassium: 4.1 mmol/L (ref 3.5–5.1)
Sodium: 138 mmol/L (ref 135–145)

## 2019-02-04 LAB — T4, FREE: FREE T4: 0.68 ng/dL — AB (ref 0.82–1.77)

## 2019-02-04 LAB — PREGNANCY, URINE: Preg Test, Ur: NEGATIVE

## 2019-02-04 LAB — TSH: TSH: 1.431 u[IU]/mL (ref 0.350–4.500)

## 2019-02-04 LAB — GLUCOSE, CAPILLARY: Glucose-Capillary: 78 mg/dL (ref 70–99)

## 2019-02-04 MED ORDER — SODIUM CHLORIDE 0.9% FLUSH: 3.0000 mL | Freq: Once | INTRAVENOUS | Status: DC

## 2019-02-04 NOTE — Discharge Instructions (Signed)
Your chest x-ray and lab test today were all okay.  Continue taking your blood pressure medicine and the furosemide as needed.  Follow-up with your doctor next week for continued monitoring of your symptoms.  At this time, no changes to your medications are needed.

## 2019-02-04 NOTE — ED Provider Notes (Signed)
[facility] Emergency Department Provider Note  ____________________________________________  Time seen: Approximately 1:44 PM  I have reviewed the triage vital signs and the nursing notes.   HISTORY  Chief Complaint Leg Swelling and Numbness    HPI Alexandria Larson is a 53 y.o. female with a history of hypertension who comes the ED complaining of generalized swelling of her ankles and legs.  She also feels like she has swelling in her neck.  She denies chest pain shortness of breath or dyspnea on exertion.  She reports that she has been sleeping in a recliner for the past year because she gets short of breath when she lays down flat, but has not had to sleep more upright recently.  She is on lisinopril/HCTZ for antihypertensive, and also has a prescription for Lasix which she takes as needed at the direction of her primary care physician.  The symptoms have been going on for the past week, and she took a Lasix tablet which did help with the swelling.  Symptoms are constant, waxing and waning, no aggravating symptoms, alleviated by Lasix use.     Past Medical History:  Diagnosis Date  . Hypertension      There are no active problems to display for this patient.    Past Surgical History:  Procedure Laterality Date  . ABDOMINAL HYSTERECTOMY    . BREAST BIOPSY Right    negative 2005  . TONSILLECTOMY       Prior to Admission medications   Medication Sig Start Date End Date Taking? Authorizing Provider  amLODipine (NORVASC) 5 MG tablet Take 1 tablet (5 mg total) by mouth daily. 06/22/15 06/21/16  Sharman Cheek, MD  dicyclomine (BENTYL) 20 MG tablet Take 1 tablet (20 mg total) by mouth 3 (three) times daily as needed for spasms. 06/22/15   Sharman Cheek, MD  HYDROcodone-acetaminophen (NORCO/VICODIN) 5-325 MG tablet Take 1 tablet by mouth every 4 (four) hours as needed for moderate pain. 02/06/16   Tommi Rumps, PA-C  ibuprofen (ADVIL,MOTRIN) 600 MG  tablet Take 1 tablet (600 mg total) by mouth every 8 (eight) hours as needed. 02/06/16   Tommi Rumps, PA-C  ondansetron (ZOFRAN ODT) 8 MG disintegrating tablet Take 1 tablet (8 mg total) by mouth every 8 (eight) hours as needed for nausea or vomiting. 06/22/15   Sharman Cheek, MD     Allergies Erythromycin and Keflex [cephalexin]   No family history on file.  Social History Social History   Tobacco Use  . Smoking status: Current Every Day Smoker  . Smokeless tobacco: Never Used  Substance Use Topics  . Alcohol use: Yes  . Drug use: Not on file    Review of Systems  Constitutional:   No fever or chills.  ENT:   No sore throat. No rhinorrhea. Cardiovascular:   No chest pain or syncope. Respiratory:   No dyspnea or cough. Gastrointestinal:   Negative for abdominal pain, vomiting and diarrhea.  Musculoskeletal:   Positive leg swelling. All other systems reviewed and are negative except as documented above in ROS and HPI.  ____________________________________________   PHYSICAL EXAM:  VITAL SIGNS: ED Triage Vitals  Enc Vitals Group     BP 02/04/19 0739 (!) 158/73     Pulse Rate 02/04/19 0739 86     Resp 02/04/19 0739 18     Temp 02/04/19 0739 98.4 F (36.9 C)     Temp Source 02/04/19 0739 Oral     SpO2 02/04/19 0739 97 %  Weight 02/04/19 0740 158 lb (71.7 kg)     Height 02/04/19 0740 5\' 5"  (1.651 m)     Head Circumference --      Peak Flow --      Pain Score --      Pain Loc --      Pain Edu? --      Excl. in GC? --     Vital signs reviewed, nursing assessments reviewed.   Constitutional:   Alert and oriented. Non-toxic appearance. Eyes:   Conjunctivae are normal. EOMI. PERRL. ENT      Head:   Normocephalic and atraumatic.      Nose:   No congestion/rhinnorhea.       Mouth/Throat:   MMM, no pharyngeal erythema. No peritonsillar mass.       Neck:   No meningismus. Full ROM.  Thyroid nonpalpable Hematological/Lymphatic/Immunilogical:   No  cervical lymphadenopathy. Cardiovascular:   RRR. Symmetric bilateral radial and DP pulses.  No murmurs. Cap refill less than 2 seconds. Respiratory:   Normal respiratory effort without tachypnea/retractions.  No focal wheezing or crackles  gastrointestinal:   Soft and nontender. Non distended. There is no CVA tenderness.  No rebound, rigidity, or guarding. Musculoskeletal:   Normal range of motion in all extremities. No joint effusions.  No lower extremity tenderness.  Trace pitting edema bilateral lower extremities, symmetric.  No calf tenderness, negative Homans sign.. Neurologic:   Normal speech and language.  Motor grossly intact. No acute focal neurologic deficits are appreciated.  Skin:    Skin is warm, dry and intact. No rash noted.  No petechiae, purpura, or bullae.  ____________________________________________    LABS (pertinent positives/negatives) (all labs ordered are listed, but only abnormal results are displayed) Labs Reviewed  BASIC METABOLIC PANEL - Abnormal; Notable for the following components:      Result Value   Glucose, Bld 106 (*)    BUN 25 (*)    All other components within normal limits  URINALYSIS, COMPLETE (UACMP) WITH MICROSCOPIC - Abnormal; Notable for the following components:   Color, Urine YELLOW (*)    APPearance CLEAR (*)    All other components within normal limits  T4, FREE - Abnormal; Notable for the following components:   Free T4 0.68 (*)    All other components within normal limits  CBC  TSH  PREGNANCY, URINE  GLUCOSE, CAPILLARY  CBG MONITORING, ED  POC URINE PREG, ED   ____________________________________________   EKG  Interpreted by me  Date: 02/04/2019  Rate: 79  Rhythm: normal sinus rhythm  QRS Axis: normal  Intervals: normal  ST/T Wave abnormalities: normal  Conduction Disutrbances: none  Narrative Interpretation: unremarkable      ____________________________________________    RADIOLOGY  Dg Chest 2  View  Result Date: 02/04/2019 CLINICAL DATA:  Generalized swelling. EXAM: CHEST - 2 VIEW COMPARISON:  11/07/2017 FINDINGS: Cardiomediastinal silhouette is normal. Mediastinal contours appear intact. There is no evidence of focal airspace consolidation, pleural effusion or pneumothorax. Osseous structures are without acute abnormality. Soft tissues are grossly normal. IMPRESSION: No active cardiopulmonary disease. Electronically Signed   By: Ted Mcalpine M.D.   On: 02/04/2019 13:02    ____________________________________________   PROCEDURES Procedures  ____________________________________________  DIFFERENTIAL DIAGNOSIS   Hypothyroidism, pulmonary edema, pleural effusion, peripheral edema.  Doubt ACS PE or carditis, doubt DVT or soft tissue infection  CLINICAL IMPRESSION / ASSESSMENT AND PLAN / ED COURSE  Medications ordered in the ED: Medications  sodium chloride flush (NS) 0.9 %  injection 3 mL (has no administration in time range)    Pertinent labs & imaging results that were available during my care of the patient were reviewed by me and considered in my medical decision making (see chart for details).    Patient presents with subacute peripheral edema, improved by using her Lasix that is been's prescribed for the symptoms by her primary care doctor.  No worrisome cardiopulmonary symptoms.  She does report chronic orthopnea which is unchanged from baseline.  EKG labs and chest x-ray today are all unremarkable.  Vital signs are unremarkable.  Patient is nontoxic and stable for discharge home to continue outpatient follow-up.      ____________________________________________   FINAL CLINICAL IMPRESSION(S) / ED DIAGNOSES    Final diagnoses:  Peripheral edema     ED Discharge Orders    None      Portions of this note were generated with dragon dictation software. Dictation errors may occur despite best attempts at proofreading.   Sharman CheekStafford, Lasundra Hascall,  MD 02/04/19 1348

## 2019-02-04 NOTE — ED Triage Notes (Signed)
Patient presents to the ED with generalized swelling.  Patient reports swelling in her ankles, lower legs and neck.  Patient states she took a fluid pill last week for some swelling in her ankles but it has gotten worse over the past 2 days and today in particular.  Patient states, "I feel like my speech is starting to slur because of the swelling in my neck."  Patient also reports numbness bilaterally to arms that is improved when patient shakes her arms.  Patient reports she has had this intermittently over the past month but it is worse today. Patient reports feeling weak, dizzy and tired.  Patient ambulatory to registration with steady gait.  Speaking in full sentences with no obvious slurred speech.  Swelling is not obvious.

## 2019-07-13 ENCOUNTER — Other Ambulatory Visit: Payer: Self-pay | Admitting: Family Medicine

## 2019-07-13 DIAGNOSIS — Z1231 Encounter for screening mammogram for malignant neoplasm of breast: Secondary | ICD-10-CM

## 2019-08-24 ENCOUNTER — Ambulatory Visit
Admission: RE | Admit: 2019-08-24 | Discharge: 2019-08-24 | Disposition: A | Discharge status: Home / Self Care | Payer: 59 | Source: Ambulatory Visit | Attending: Family Medicine | Admitting: Family Medicine

## 2019-08-24 DIAGNOSIS — Z1231 Encounter for screening mammogram for malignant neoplasm of breast: Secondary | ICD-10-CM | POA: Diagnosis present

## 2019-10-12 ENCOUNTER — Other Ambulatory Visit: Payer: Self-pay

## 2019-10-12 DIAGNOSIS — Z20822 Contact with and (suspected) exposure to covid-19: Secondary | ICD-10-CM

## 2019-10-14 LAB — NOVEL CORONAVIRUS, NAA: SARS-CoV-2, NAA: NOT DETECTED

## 2020-02-25 ENCOUNTER — Observation Stay
Admission: EM | Admit: 2020-02-25 | Discharge: 2020-02-26 | Disposition: A | Discharge status: Home / Self Care | Payer: 59 | Attending: Internal Medicine | Admitting: Internal Medicine

## 2020-02-25 ENCOUNTER — Other Ambulatory Visit: Payer: Self-pay

## 2020-02-25 ENCOUNTER — Emergency Department: Payer: 59

## 2020-02-25 DIAGNOSIS — Z20822 Contact with and (suspected) exposure to covid-19: Secondary | ICD-10-CM | POA: Insufficient documentation

## 2020-02-25 DIAGNOSIS — R079 Chest pain, unspecified: Secondary | ICD-10-CM | POA: Diagnosis present

## 2020-02-25 DIAGNOSIS — D509 Iron deficiency anemia, unspecified: Secondary | ICD-10-CM | POA: Insufficient documentation

## 2020-02-25 DIAGNOSIS — Z720 Tobacco use: Secondary | ICD-10-CM | POA: Diagnosis not present

## 2020-02-25 DIAGNOSIS — F17210 Nicotine dependence, cigarettes, uncomplicated: Secondary | ICD-10-CM | POA: Insufficient documentation

## 2020-02-25 DIAGNOSIS — R072 Precordial pain: Secondary | ICD-10-CM | POA: Diagnosis not present

## 2020-02-25 DIAGNOSIS — Z881 Allergy status to other antibiotic agents status: Secondary | ICD-10-CM | POA: Insufficient documentation

## 2020-02-25 DIAGNOSIS — Z79899 Other long term (current) drug therapy: Secondary | ICD-10-CM | POA: Diagnosis not present

## 2020-02-25 DIAGNOSIS — R1011 Right upper quadrant pain: Secondary | ICD-10-CM | POA: Diagnosis present

## 2020-02-25 DIAGNOSIS — E119 Type 2 diabetes mellitus without complications: Secondary | ICD-10-CM | POA: Insufficient documentation

## 2020-02-25 DIAGNOSIS — E785 Hyperlipidemia, unspecified: Secondary | ICD-10-CM | POA: Insufficient documentation

## 2020-02-25 DIAGNOSIS — I10 Essential (primary) hypertension: Secondary | ICD-10-CM | POA: Diagnosis not present

## 2020-02-25 LAB — CBC WITH DIFFERENTIAL/PLATELET
Abs Immature Granulocytes: 0.06 10*3/uL (ref 0.00–0.07)
Basophils Absolute: 0 10*3/uL (ref 0.0–0.1)
Basophils Relative: 0 %
Eosinophils Absolute: 0.2 10*3/uL (ref 0.0–0.5)
Eosinophils Relative: 1 %
HCT: 33.5 % — ABNORMAL LOW (ref 36.0–46.0)
Hemoglobin: 10.2 g/dL — ABNORMAL LOW (ref 12.0–15.0)
Immature Granulocytes: 1 %
Lymphocytes Relative: 16 %
Lymphs Abs: 2.1 10*3/uL (ref 0.7–4.0)
MCH: 24.1 pg — ABNORMAL LOW (ref 26.0–34.0)
MCHC: 30.4 g/dL (ref 30.0–36.0)
MCV: 79.2 fL — ABNORMAL LOW (ref 80.0–100.0)
Monocytes Absolute: 0.9 10*3/uL (ref 0.1–1.0)
Monocytes Relative: 7 %
Neutro Abs: 9.6 10*3/uL — ABNORMAL HIGH (ref 1.7–7.7)
Neutrophils Relative %: 75 %
Platelets: 531 10*3/uL — ABNORMAL HIGH (ref 150–400)
RBC: 4.23 MIL/uL (ref 3.87–5.11)
RDW: 15.6 % — ABNORMAL HIGH (ref 11.5–15.5)
WBC: 12.9 10*3/uL — ABNORMAL HIGH (ref 4.0–10.5)
nRBC: 0 % (ref 0.0–0.2)

## 2020-02-25 LAB — URINALYSIS, COMPLETE (UACMP) WITH MICROSCOPIC
Bacteria, UA: NONE SEEN
Bilirubin Urine: NEGATIVE
Glucose, UA: NEGATIVE mg/dL
Hgb urine dipstick: NEGATIVE
Ketones, ur: NEGATIVE mg/dL
Leukocytes,Ua: NEGATIVE
Nitrite: NEGATIVE
Protein, ur: NEGATIVE mg/dL
Specific Gravity, Urine: >1.046 — ABNORMAL HIGH (ref 1.005–1.030)
Squamous Epithelial / HPF: NONE SEEN (ref 0–5)
pH: 6 (ref 5.0–8.0)

## 2020-02-25 LAB — URINE DRUG SCREEN, QUALITATIVE (ARMC ONLY)
Amphetamines, Ur Screen: NOT DETECTED
Barbiturates, Ur Screen: NOT DETECTED
Benzodiazepine, Ur Scrn: NOT DETECTED
Cannabinoid 50 Ng, Ur ~~LOC~~: NOT DETECTED
Cocaine Metabolite,Ur ~~LOC~~: NOT DETECTED
MDMA (Ecstasy)Ur Screen: NOT DETECTED
Methadone Scn, Ur: NOT DETECTED
Opiate, Ur Screen: NOT DETECTED
Phencyclidine (PCP) Ur S: NOT DETECTED
Tricyclic, Ur Screen: NOT DETECTED

## 2020-02-25 LAB — TROPONIN I (HIGH SENSITIVITY)
Troponin I (High Sensitivity): 4 ng/L (ref <18)
Troponin I (High Sensitivity): 5 ng/L (ref <18)

## 2020-02-25 LAB — COMPREHENSIVE METABOLIC PANEL
ALT: 32 U/L (ref 0–44)
AST: 54 U/L — ABNORMAL HIGH (ref 15–41)
Albumin: 4.3 g/dL (ref 3.5–5.0)
Alkaline Phosphatase: 226 U/L — ABNORMAL HIGH (ref 38–126)
Anion gap: 12 (ref 5–15)
BUN: 25 mg/dL — ABNORMAL HIGH (ref 6–20)
CO2: 27 mmol/L (ref 22–32)
Calcium: 9.2 mg/dL (ref 8.9–10.3)
Chloride: 99 mmol/L (ref 98–111)
Creatinine, Ser: 0.8 mg/dL (ref 0.44–1.00)
GFR calc Af Amer: >60 mL/min (ref >60)
GFR calc non Af Amer: >60 mL/min (ref >60)
Glucose, Bld: 125 mg/dL — ABNORMAL HIGH (ref 70–99)
Potassium: 3.6 mmol/L (ref 3.5–5.1)
Sodium: 138 mmol/L (ref 135–145)
Total Bilirubin: 0.6 mg/dL (ref 0.3–1.2)
Total Protein: 7.8 g/dL (ref 6.5–8.1)

## 2020-02-25 LAB — LIPASE, BLOOD: Lipase: 46 U/L (ref 11–51)

## 2020-02-25 LAB — CK: Total CK: 91 U/L (ref 38–234)

## 2020-02-25 LAB — RESPIRATORY PANEL BY RT PCR (FLU A&B, COVID)
Influenza A by PCR: NEGATIVE
Influenza B by PCR: NEGATIVE
SARS Coronavirus 2 by RT PCR: NEGATIVE

## 2020-02-25 MED ORDER — HYDROMORPHONE HCL 1 MG/ML IJ SOLN
0.5000 mg | Freq: Once | INTRAMUSCULAR | Status: AC
  Administered 2020-02-25: 10:00:00 0.5 mg via INTRAVENOUS
  Filled 2020-02-25: qty 1

## 2020-02-25 MED ORDER — NITROGLYCERIN 0.4 MG SL SUBL: 0.4000 mg | SUBLINGUAL_TABLET | SUBLINGUAL | Status: DC | PRN

## 2020-02-25 MED ORDER — ONDANSETRON HCL 4 MG/2ML IJ SOLN
4.0000 mg | Freq: Once | INTRAMUSCULAR | Status: AC
  Administered 2020-02-25: 10:00:00 4 mg via INTRAVENOUS
  Filled 2020-02-25: qty 2

## 2020-02-25 MED ORDER — SODIUM CHLORIDE 0.9 % IV BOLUS
500.0000 mL | Freq: Once | INTRAVENOUS | Status: AC
  Administered 2020-02-25: 13:00:00 500 mL via INTRAVENOUS

## 2020-02-25 MED ORDER — LIDOCAINE VISCOUS HCL 2 % MT SOLN
15.0000 mL | Freq: Once | OROMUCOSAL | Status: AC
  Administered 2020-02-25: 15:00:00 15 mL via ORAL
  Filled 2020-02-25: qty 15

## 2020-02-25 MED ORDER — ACETAMINOPHEN 325 MG PO TABS
650.0000 mg | ORAL_TABLET | ORAL | Status: DC | PRN
  Administered 2020-02-26: 650 mg via ORAL
  Filled 2020-02-25: qty 2

## 2020-02-25 MED ORDER — KETOROLAC TROMETHAMINE 30 MG/ML IJ SOLN
15.0000 mg | Freq: Once | INTRAMUSCULAR | Status: AC
  Administered 2020-02-25: 15:00:00 15 mg via INTRAVENOUS
  Filled 2020-02-25: qty 1

## 2020-02-25 MED ORDER — ENOXAPARIN SODIUM 40 MG/0.4ML ~~LOC~~ SOLN
40.0000 mg | SUBCUTANEOUS | Status: DC
  Administered 2020-02-25: 23:00:00 40 mg via SUBCUTANEOUS
  Filled 2020-02-25: qty 0.4

## 2020-02-25 MED ORDER — SODIUM CHLORIDE 0.9 % IV BOLUS
1000.0000 mL | Freq: Once | INTRAVENOUS | Status: AC
  Administered 2020-02-25: 15:00:00 1000 mL via INTRAVENOUS

## 2020-02-25 MED ORDER — ATORVASTATIN CALCIUM 20 MG PO TABS
20.0000 mg | ORAL_TABLET | Freq: Every day | ORAL | Status: DC
  Administered 2020-02-25: 23:00:00 20 mg via ORAL
  Filled 2020-02-25: qty 1

## 2020-02-25 MED ORDER — MORPHINE SULFATE (PF) 2 MG/ML IV SOLN
2.0000 mg | INTRAVENOUS | Status: DC | PRN
  Administered 2020-02-25: 2 mg via INTRAVENOUS
  Filled 2020-02-25: qty 1

## 2020-02-25 MED ORDER — ONDANSETRON HCL 4 MG/2ML IJ SOLN: 4.0000 mg | Freq: Four times a day (QID) | INTRAMUSCULAR | Status: DC | PRN

## 2020-02-25 MED ORDER — IOHEXOL 350 MG/ML SOLN
100.0000 mL | Freq: Once | INTRAVENOUS | Status: AC | PRN
  Administered 2020-02-25: 100 mL via INTRAVENOUS

## 2020-02-25 MED ORDER — HYDROMORPHONE HCL 1 MG/ML IJ SOLN
0.5000 mg | Freq: Once | INTRAMUSCULAR | Status: AC
  Administered 2020-02-25: 0.5 mg via INTRAVENOUS
  Filled 2020-02-25: qty 1

## 2020-02-25 MED ORDER — ASPIRIN EC 81 MG PO TBEC
81.0000 mg | DELAYED_RELEASE_TABLET | Freq: Every day | ORAL | Status: DC
  Administered 2020-02-26: 09:00:00 81 mg via ORAL
  Filled 2020-02-25: qty 1

## 2020-02-25 MED ORDER — ALUM & MAG HYDROXIDE-SIMETH 200-200-20 MG/5ML PO SUSP
30.0000 mL | Freq: Once | ORAL | Status: AC
  Administered 2020-02-25: 15:00:00 30 mL via ORAL
  Filled 2020-02-25: qty 30

## 2020-02-25 MED ORDER — ACETAMINOPHEN 500 MG PO TABS
1000.0000 mg | ORAL_TABLET | Freq: Once | ORAL | Status: AC
  Administered 2020-02-25: 15:00:00 1000 mg via ORAL
  Filled 2020-02-25: qty 2

## 2020-02-25 NOTE — Discharge Instructions (Addendum)
Your work-up was reassuring.  You can call cardiology to make an outpatient follow-up appointment.    1. No demonstrable thoracic aortic aneurysm or dissection. No appreciable atherosclerotic plaque within the major thoracic arterial vessels.   2. Scattered 2-4 mm nodular opacities bilaterally. Areas of mild atelectatic change. No edema or airspace opacity. No follow-up needed if patient is low-risk (and has no known or suspected primary neoplasm). Non-contrast chest CT can be considered in 12 months if patient is high-risk. This recommendation follows the consensus statement: Guidelines for Management of Incidental Pulmonary Nodules Detected on CT Images: From the Fleischner Society 2017; Radiology 2017; 284:228-243.   3.  No evident thoracic adenopathy.   CT angiogram abdomen; CT angiogram pelvis:   1. Minimal atherosclerotic plaque in the distal aorta. No other atherosclerotic plaque noted in the aorta, major pelvic, and major mesenteric arterial vessels. No aneurysm or dissection involving the aorta, major mesenteric, or major pelvic arterial vessels. No fibromuscular dysplasia evident.   2. There is urinary bladder, vaginal, and rectal prolapse. Uterus absent.   3. No bowel obstruction. No abscess in the abdomen or pelvis. Appendix appears normal.   4. No hydronephrosis. No renal or ureteral calculus on either side.     Diabetes Mellitus and Nutrition, Adult When you have diabetes (diabetes mellitus), it is very important to have healthy eating habits because your blood sugar (glucose) levels are greatly affected by what you eat and drink. Eating healthy foods in the appropriate amounts, at about the same times every day, can help you:  Control your blood glucose.  Lower your risk of heart disease.  Improve your blood pressure.  Reach or maintain a healthy weight. Every person with diabetes is different, and each person has different needs for a meal plan. Your  health care provider may recommend that you work with a diet and nutrition specialist (dietitian) to make a meal plan that is best for you. Your meal plan may vary depending on factors such as:  The calories you need.  The medicines you take.  Your weight.  Your blood glucose, blood pressure, and cholesterol levels.  Your activity level.  Other health conditions you have, such as heart or kidney disease. How do carbohydrates affect me? Carbohydrates, also called carbs, affect your blood glucose level more than any other type of food. Eating carbs naturally raises the amount of glucose in your blood. Carb counting is a method for keeping track of how many carbs you eat. Counting carbs is important to keep your blood glucose at a healthy level, especially if you use insulin or take certain oral diabetes medicines. It is important to know how many carbs you can safely have in each meal. This is different for every person. Your dietitian can help you calculate how many carbs you should have at each meal and for each snack. Foods that contain carbs include:  Bread, cereal, rice, pasta, and crackers.  Potatoes and corn.  Peas, beans, and lentils.  Milk and yogurt.  Fruit and juice.  Desserts, such as cakes, cookies, ice cream, and candy. How does alcohol affect me? Alcohol can cause a sudden decrease in blood glucose (hypoglycemia), especially if you use insulin or take certain oral diabetes medicines. Hypoglycemia can be a life-threatening condition. Symptoms of hypoglycemia (sleepiness, dizziness, and confusion) are similar to symptoms of having too much alcohol. If your health care provider says that alcohol is safe for you, follow these guidelines:  Limit alcohol intake to no more than  1 drink per day for nonpregnant women and 2 drinks per day for men. One drink equals 12 oz of beer, 5 oz of wine, or 1 oz of hard liquor.  Do not drink on an empty stomach.  Keep yourself hydrated  with water, diet soda, or unsweetened iced tea.  Keep in mind that regular soda, juice, and other mixers may contain a lot of sugar and must be counted as carbs. What are tips for following this plan?  Reading food labels  Start by checking the serving size on the "Nutrition Facts" label of packaged foods and drinks. The amount of calories, carbs, fats, and other nutrients listed on the label is based on one serving of the item. Many items contain more than one serving per package.  Check the total grams (g) of carbs in one serving. You can calculate the number of servings of carbs in one serving by dividing the total carbs by 15. For example, if a food has 30 g of total carbs, it would be equal to 2 servings of carbs.  Check the number of grams (g) of saturated and trans fats in one serving. Choose foods that have low or no amount of these fats.  Check the number of milligrams (mg) of salt (sodium) in one serving. Most people should limit total sodium intake to less than 2,300 mg per day.  Always check the nutrition information of foods labeled as "low-fat" or "nonfat". These foods may be higher in added sugar or refined carbs and should be avoided.  Talk to your dietitian to identify your daily goals for nutrients listed on the label. Shopping  Avoid buying canned, premade, or processed foods. These foods tend to be high in fat, sodium, and added sugar.  Shop around the outside edge of the grocery store. This includes fresh fruits and vegetables, bulk grains, fresh meats, and fresh dairy. Cooking  Use low-heat cooking methods, such as baking, instead of high-heat cooking methods like deep frying.  Cook using healthy oils, such as olive, canola, or sunflower oil.  Avoid cooking with butter, cream, or high-fat meats. Meal planning  Eat meals and snacks regularly, preferably at the same times every day. Avoid going long periods of time without eating.  Eat foods high in fiber, such  as fresh fruits, vegetables, beans, and whole grains. Talk to your dietitian about how many servings of carbs you can eat at each meal.  Eat 4-6 ounces (oz) of lean protein each day, such as lean meat, chicken, fish, eggs, or tofu. One oz of lean protein is equal to: ? 1 oz of meat, chicken, or fish. ? 1 egg. ?  cup of tofu.  Eat some foods each day that contain healthy fats, such as avocado, nuts, seeds, and fish. Lifestyle  Check your blood glucose regularly.  Exercise regularly as told by your health care provider. This may include: ? 150 minutes of moderate-intensity or vigorous-intensity exercise each week. This could be brisk walking, biking, or water aerobics. ? Stretching and doing strength exercises, such as yoga or weightlifting, at least 2 times a week.  Take medicines as told by your health care provider.  Do not use any products that contain nicotine or tobacco, such as cigarettes and e-cigarettes. If you need help quitting, ask your health care provider.  Work with a Veterinary surgeon or diabetes educator to identify strategies to manage stress and any emotional and social challenges. Questions to ask a health care provider  Do I need  to meet with a diabetes educator?  Do I need to meet with a dietitian?  What number can I call if I have questions?  When are the best times to check my blood glucose? Where to find more information:  American Diabetes Association: diabetes.org  Academy of Nutrition and Dietetics: www.eatright.[employer] of Diabetes and Digestive and Kidney Diseases (NIH): [employer].tn Summary  A healthy meal plan will help you control your blood glucose and maintain a healthy lifestyle.  Working with a diet and nutrition specialist (dietitian) can help you make a meal plan that is best for you.  Keep in mind that carbohydrates (carbs) and alcohol have immediate effects on your blood glucose levels. It is important to count carbs and  to use alcohol carefully. This information is not intended to replace advice given to you by your health care provider. Make sure you discuss any questions you have with your health care provider. Document Revised: 10/24/2017 Document Reviewed: 12/16/2016 Elsevier Patient Education  2020 [employer].

## 2020-02-25 NOTE — ED Provider Notes (Signed)
Patient still having 10 out of 10 chest pain.  She states it feels like an elephant is still sitting on her chest.  No clear etiology for her symptoms but I do not feel comfortable sending her home still having severe chest pain that we can explain.  I will discuss with the hospitalist for observation and possible stress testing.   Emily Filbert, MD 02/25/20 [phone]

## 2020-02-25 NOTE — H&P (Addendum)
TRH H&P   Patient Demographics:    Alexandria Larson, is a 54 y.o. female  MRN: [id number]   DOB - [birth year]  Admit Date - 02/25/2020  Outpatient Primary MD for the patient is [facility]  Referring MD: Dr. Jimmye Norman  Outpatient Specialists: None  Patient coming from: Home  Chief Complaint  Patient presents with  . Chest Pain      HPI:    Alexandria Larson  is a 54 y.o. female, with history of hypertension, hyperlipidemia, active smoker who presented to the ED with acute onset of sharp substernal chest pain radiating to the back since this morning.  Pain was 10/10 in severity without any aggravating or relieving factors.  Patient reports having pain shortly after waking up.  Denies any shortness of breath, nausea, vomiting, palpitations, headache, blurred vision, dizziness, fevers, chills, abdominal pain, diarrhea, dysuria, tingling or numbness of extremities. Denies lifting heavy weight or chest trauma.  Denies similar symptoms in the past.  Denies any sick contact, recent illness or exposure to COVID-19. Reports smoking about half pack per day and occasional marijuana use.  Also reports having a few drinks in a week but denies any cocaine or other illicit drug use.  In the ED vitals were stable.  Blood work showed WBC of 12.9, hemoglobin of 10.2 with low MCV, platelets of 531, normal chemistry including LFTs except for blood glucose of 125, high sensitive troponin x2 normal, normal UA and normal lipase. EKG showed normal sinus rhythm at 67 with no ST-T changes. ED physician ordered CT angiogram of the chest with concern for aortic dissection and was negative for dissection or PE. Ultrasound of the abdomen also done to rule out acute cholecystitis or pancreatitis and was unremarkable. In the ED she was given 1.5 L normal saline bolus, Dilaudid 0.5 mg x 2, Toradol 15 mg x 1  and Maalox however patient still complaining of 10/10 pain in the chest as if an elephant was sitting on her chest. Given persistent symptoms hospitalist consulted for observation to telemetry.   Review of systems:    In addition to the HPI above,  No Fever-chills, No Headache, No changes with Vision or hearing, No problems swallowing food or Liquids, Chest pain + + + , no cough or Shortness of Breath, No Abdominal pain, No Nausea or Vommitting, Bowel movements are regular, No Blood in stool or Urine, No dysuria, No new skin rashes or bruises, No new joints pains-aches,  No new weakness, tingling, numbness in any extremity, No recent weight gain or loss, No polyuria, polydypsia or polyphagia, No significant Mental Stressors.     With Past History of the following :    Past Medical History:  Diagnosis Date  . Hypertension       Past Surgical History:  Procedure Laterality Date  . ABDOMINAL HYSTERECTOMY    . BREAST  EXCISIONAL BIOPSY Right 2005   benign  . TONSILLECTOMY        Social History:     Social History   Tobacco Use  . Smoking status: Current Every Day Smoker  . Smokeless tobacco: Never Used  Substance Use Topics  . Alcohol use: Yes     Lives -Home  Mobility -independent     Family History :   Grandmother had heart disease.  Home Medications:   Prior to Admission medications   Medication Sig Start Date End Date Taking? Authorizing Provider  atorvastatin (LIPITOR) 20 MG tablet Take 20 mg by mouth daily. 02/11/20  Yes [provider]  furosemide (LASIX) 40 MG tablet Take 40 mg by mouth 2 (two) times daily. 02/25/20  Yes [provider]  lisinopril-hydrochlorothiazide (ZESTORETIC) 10-12.5 MG tablet Take 1 tablet by mouth daily. 02/11/20  Yes [provider]  amLODipine (NORVASC) 5 MG tablet Take 1 tablet (5 mg total) by mouth daily. Patient not taking: Reported on 02/25/2020 06/22/15 06/21/16  Sharman CheekStafford, Phillip, MD    dicyclomine (BENTYL) 20 MG tablet Take 1 tablet (20 mg total) by mouth 3 (three) times daily as needed for spasms. Patient not taking: Reported on 02/25/2020 06/22/15   Sharman CheekStafford, Phillip, MD  HYDROcodone-acetaminophen (NORCO/VICODIN) 5-325 MG tablet Take 1 tablet by mouth every 4 (four) hours as needed for moderate pain. Patient not taking: Reported on 02/25/2020 02/06/16   Tommi RumpsSummers, Rhonda L, PA-C  ibuprofen (ADVIL,MOTRIN) 600 MG tablet Take 1 tablet (600 mg total) by mouth every 8 (eight) hours as needed. Patient not taking: Reported on 02/25/2020 02/06/16   Bridget HartshornSummers, Rhonda L, PA-C  ondansetron (ZOFRAN ODT) 8 MG disintegrating tablet Take 1 tablet (8 mg total) by mouth every 8 (eight) hours as needed for nausea or vomiting. Patient not taking: Reported on 02/25/2020 06/22/15   Sharman CheekStafford, Phillip, MD     Allergies:     Allergies  Allergen Reactions  . Erythromycin   . Keflex [Cephalexin] Rash     Physical Exam:   Vitals  Blood pressure 115/61, pulse 79, temperature 98.4 F (36.9 C), temperature source Oral, resp. rate 16, height 5\' 5"  (1.651 m), weight 74.8 kg, SpO2 98 %.   General: Middle-aged female lying in bed in no acute distress HEENT: Pupils reactive bilaterally, EOMI, no pallor, moist mucosa, supple neck Chest: Reproducible pain on pressure over lower sternum, clear to auscultation bilateral, no added sound CVs: S1-S2 tachycardic (low 100s), no murmurs rub or gallop GI: Soft, nondistended, nontender, bowel sounds present Musculoskeletal: Warm, no edema CNS: Alert and oriented, nonfocal   Data Review:    CBC Recent Labs  Lab 02/25/20 1006  WBC 12.9*  HGB 10.2*  HCT 33.5*  PLT 531*  MCV 79.2*  MCH 24.1*  MCHC 30.4  RDW 15.6*  LYMPHSABS 2.1  MONOABS 0.9  EOSABS 0.2  BASOSABS 0.0   ------------------------------------------------------------------------------------------------------------------  Chemistries  Recent Labs  Lab 02/25/20 1006  NA 138  K 3.6  CL 99   CO2 27  GLUCOSE 125*  BUN 25*  CREATININE 0.80  CALCIUM 9.2  AST 54*  ALT 32  ALKPHOS 226*  BILITOT 0.6   ------------------------------------------------------------------------------------------------------------------ estimated creatinine clearance is 83.2 mL/min (by C-G formula based on SCr of 0.8 mg/dL). ------------------------------------------------------------------------------------------------------------------ No results for input(s): TSH, T4TOTAL, T3FREE, THYROIDAB in the last 72 hours.  Invalid input(s): FREET3  Coagulation profile No results for input(s): INR, PROTIME in the last 168 hours. ------------------------------------------------------------------------------------------------------------------- No results for input(s): DDIMER in the last 72  hours. -------------------------------------------------------------------------------------------------------------------  Cardiac Enzymes No results for input(s): CKMB, TROPONINI, MYOGLOBIN in the last 168 hours.  Invalid input(s): CK ------------------------------------------------------------------------------------------------------------------ No results found for: BNP   ---------------------------------------------------------------------------------------------------------------  Urinalysis    Component Value Date/Time   COLORURINE STRAW (A) 02/25/2020 1237   APPEARANCEUR CLEAR (A) 02/25/2020 1237   APPEARANCEUR HAZY 02/22/2013 1013   LABSPEC >1.046 (H) 02/25/2020 1237   LABSPEC 1.020 02/22/2013 1013   PHURINE 6.0 02/25/2020 1237   GLUCOSEU NEGATIVE 02/25/2020 1237   GLUCOSEU NEGATIVE 02/22/2013 1013   HGBUR NEGATIVE 02/25/2020 1237   BILIRUBINUR NEGATIVE 02/25/2020 1237   BILIRUBINUR NEGATIVE 02/22/2013 1013   KETONESUR NEGATIVE 02/25/2020 1237   PROTEINUR NEGATIVE 02/25/2020 1237   NITRITE NEGATIVE 02/25/2020 1237   LEUKOCYTESUR NEGATIVE 02/25/2020 1237   LEUKOCYTESUR NEGATIVE 02/22/2013 1013     ----------------------------------------------------------------------------------------------------------------   Imaging Results:    CT Angio Chest/Abd/Pel for Dissection W and/or Wo Contrast  Result Date: 02/25/2020 CLINICAL DATA:  Chest, abdomen, and back pain. Reported intermittent hypotension EXAM: CT ANGIOGRAPHY CHEST, ABDOMEN AND PELVIS TECHNIQUE: Initially, axial CT images were obtained through the chest without intravenous contrast material administration. Multidetector CT imaging through the chest, abdomen and pelvis was performed using the standard protocol during bolus administration of intravenous contrast. Multiplanar reconstructed images and MIPs were obtained and reviewed to evaluate the vascular anatomy. CONTRAST:  OMNIPAQUE IOHEXOL 350 MG/ML SOLN COMPARISON:  CT abdomen and pelvis February 22, 2013 FINDINGS: CTA CHEST FINDINGS Cardiovascular: On noncontrast enhanced study, there is no demonstrable intramural hematoma. There is no evident mediastinal hematoma. No thoracic aortic aneurysm or dissection. Visualized great vessels appear normal without appreciable atherosclerosis, aneurysm or dissection. No appreciable plaque is seen in the thoracic aorta. There is no appreciable pericardial effusion or pericardial thickening. No demonstrable pulmonary embolus. Mediastinum/Nodes: Visualized thyroid appears unremarkable. There is no evident thoracic adenopathy the. There are no esophageal lesions appreciable. Lungs/Pleura: There is slight scarring in the extreme apices bilaterally. There is slight bibasilar atelectasis. There is no edema or airspace opacity. No pleural effusions are evident. On axial slice 96 series 7, there is a 4 mm nodular opacity in the lateral segment of the right lower lobe. On axial slice 82 series 7, there is a 4 mm nodular opacity in the right middle lobe. On axial slice 100 series 7, there is a 3 mm nodular opacity in the anterior segment of the left lower lobe.  There is a nearby 2 mm nodular opacity in this area on the left seen on axial slice 102 series 7. On axial slice 66 series 7, there is a 3 mm nodular opacity in the superior segment left lower lobe. Musculoskeletal: No blastic or lytic bone lesions. No evident chest wall lesions. Review of the MIP images confirms the above findings. CTA ABDOMEN AND PELVIS FINDINGS VASCULAR Aorta: There is no abdominal aortic aneurysm or dissection. There is minimal plaque seen within the abdominal aorta distally. Celiac: The celiac artery and its branches appear widely patent throughout their respective courses. There is tortuosity in the proximal celiac artery. No aneurysm or dissection involving these vessels. SMA: Superior mesenteric artery and respective branches appear widely patent throughout their courses. No aneurysm or dissection involving these vessels. Renals: There is a single renal artery on each side. Each renal artery and its branches are widely patent. No aneurysm or dissection involving these vessels. No evident fibromuscular dysplasia. IMA: Inferior mesenteric artery and its branches are patent. No aneurysm or dissection involving these vessels. No appreciable atherosclerotic  plaque1 seen. Inflow: The major pelvic arterial vessels appear widely patent throughout their respective courses without appreciable plaque seen. No aneurysm or dissection involving these vessels. Proximal superficial femoral and profunda femoral arteries also appear unremarkable without plaque, aneurysm, or dissection. Veins: No obvious venous abnormality within the limitations of this arterial phase study. Note that there is a retroaortic left renal vein, an anatomic variant. Review of the MIP images confirms the above findings. NON-VASCULAR Hepatobiliary: No focal liver lesions are evident. Gallbladder wall is not appreciably thickened. There is no biliary duct dilatation. Pancreas: There is no pancreatic mass or inflammatory focus. Spleen:  No splenic lesions are evident. Adrenals/Urinary Tract: Adrenals bilaterally appear normal. Kidneys bilaterally show no evident mass or hydronephrosis on either side. The there is no renal or ureteral calculus on either side. The urinary bladder wall thickness is normal. There is urinary bladder prolapse, extending slightly below the level of the pubic symphysis. Stomach/Bowel: There is no appreciable bowel wall or mesenteric thickening. No evident bowel obstruction. Terminal ileum appears normal. There is no evident free air or portal venous air. There is a degree of rectal prolapse. Lymphatic: No evident adenopathy in the abdomen or pelvis. Reproductive: Uterus absent. There is evidence of vaginal prolapse. No pelvic mass evident. Other: The appendix appears normal. No evident abscess or ascites in the abdomen or pelvis. There is slight fat in the umbilicus. Musculoskeletal: No blastic or lytic bone lesions. No intramuscular lesions evident. Review of the MIP images confirms the above findings. IMPRESSION: CT angiogram chest: 1. No demonstrable thoracic aortic aneurysm or dissection. No appreciable atherosclerotic plaque within the major thoracic arterial vessels. 2. Scattered 2-4 mm nodular opacities bilaterally. Areas of mild atelectatic change. No edema or airspace opacity. No follow-up needed if patient is low-risk (and has no known or suspected primary neoplasm). Non-contrast chest CT can be considered in 12 months if patient is high-risk. This recommendation follows the consensus statement: Guidelines for Management of Incidental Pulmonary Nodules Detected on CT Images: From the Fleischner Society 2017; Radiology 2017; 284:228-243. 3.  No evident thoracic adenopathy. CT angiogram abdomen; CT angiogram pelvis: 1. Minimal atherosclerotic plaque in the distal aorta. No other atherosclerotic plaque noted in the aorta, major pelvic, and major mesenteric arterial vessels. No aneurysm or dissection involving the  aorta, major mesenteric, or major pelvic arterial vessels. No fibromuscular dysplasia evident. 2. There is urinary bladder, vaginal, and rectal prolapse. Uterus absent. 3. No bowel obstruction. No abscess in the abdomen or pelvis. Appendix appears normal. 4. No hydronephrosis. No renal or ureteral calculus on either side. Electronically Signed   By: Bretta Bang III M.D.   On: 02/25/2020 10:54   US ABDOMEN LIMITED RUQ  Result Date: 02/25/2020 CLINICAL DATA:  Right upper quadrant pain EXAM: ULTRASOUND ABDOMEN LIMITED RIGHT UPPER QUADRANT COMPARISON:  CT 02/25/2020 FINDINGS: Gallbladder: No gallstones or wall thickening visualized. No sonographic Murphy sign noted by sonographer. Common bile duct: Diameter: 5 mm Liver: No focal lesion identified. Heterogeneous echotexture with diffusely increased parenchymal echogenicity. Portal vein is patent on color Doppler imaging with normal direction of blood flow towards the liver. Other: None. IMPRESSION: 1. Heterogeneous, echogenic appearance of the liver. This is a nonspecific finding but is most commonly seen with fatty infiltration of the liver. Correlate with liver function tests. There are no obvious focal liver lesions. 2. Unremarkable sonographic appearance of the gallbladder. Electronically Signed   By: Duanne Guess D.O.   On: 02/25/2020 12:35    My personal review of  EKG: Normal sinus rhythm at 67, no ST-T changes   Assessment & Plan:    Active Problems:   Chest pain at rest Suspect musculoskeletal given reproducible pain.  However given her age, hypertension, hyperlipidemia and ongoing tobacco use and persistent pain need to rule out for ACS.  Troponin x2 was negative. EKG on admission unremarkable.  Will observe on telemetry overnight.  Heart score is 3.  Aspirin 81 mg daily.  Resume home dose statin.  IV morphine as needed and sublingual nitrate. Check CK, urine drug screen and 2D echo. Consulted cardiology (Dr. Welton Flakes) who will see the  patient.    Essential hypertension Blood pressure soft.  Will hold her home blood pressure meds.    Tobacco abuse Counseled strongly on smoking cessation.  Hyperlipidemia Continue statin.  Check lipid panel  Hyperglycemia Blood glucose of 120s.  Check A1c.  Microcytic anemia Suspect iron deficiency.  Recommend outpatient work-up.  DVT Prophylaxis: Subcu Lovenox  AM Labs Ordered, also please review Full Orders  Family Communication: Admission, patients condition and plan of care including tests being ordered have been discussed with the patient   Code Status full code  Likely DC to home tomorrow if stress test negative  Condition: Fair  Consults called: Cardiology  Admission status: Observation  Time spent in minutes : 50   Anakaren Campion M.D on 02/25/2020 at 4:51 PM  Between 7am to 7pm - Pager - [phone]. After 7pm go to [email or web address] - password [facility]  [facility] - Office  [phone]

## 2020-02-25 NOTE — ED Notes (Signed)
Pt to Ultrasound

## 2020-02-25 NOTE — ED Triage Notes (Signed)
Pt to ED via [facility] from home. Per EMS pt with c/o of sudden onset of CP with radiation to the back that started around 930am. Per EMS pt hypotensive with systolic BP in 80s.Marland Kitchen   Upon arrival pt A&Ox4. Pt normotensive. Pt stating she feel SOB. Pt leaning over holding chest. Pt stating CP feels like someone is sitting on her chest. Per pt no prior cardiac hx. Pt w/ hx of HTN and hysterectomy. Pt current smoker.

## 2020-02-25 NOTE — ED Provider Notes (Signed)
[facility] Emergency Department Provider Note  ____________________________________________   First MD Initiated Contact with Patient 02/25/20 1003     (approximate)  I have reviewed the triage vital signs and the nursing notes.   HISTORY  Chief Complaint Chest Pain    HPI Alexandria Larson is a 54 y.o. female with history of hypertension who comes in with chest pain.  Patient reports sudden onset of severe chest pain starting at 9:30 AM.  The pain is severe, constant, radiates into her back, 10 out of 10, better with sitting up.  Denies having this previously.  Patient has a history of hysterectomy.  No prior cardiac surgeries or stents placed.  Patient had one episode of vomiting nonbloody nonbilious.  No changes to her bowels.          Past Medical History:  Diagnosis Date  . Hypertension     There are no problems to display for this patient.   Past Surgical History:  Procedure Laterality Date  . ABDOMINAL HYSTERECTOMY    . BREAST EXCISIONAL BIOPSY Right 2005   benign  . TONSILLECTOMY      Prior to Admission medications   Medication Sig Start Date End Date Taking? Authorizing Provider  amLODipine (NORVASC) 5 MG tablet Take 1 tablet (5 mg total) by mouth daily. 06/22/15 06/21/16  Sharman CheekStafford, Phillip, MD  dicyclomine (BENTYL) 20 MG tablet Take 1 tablet (20 mg total) by mouth 3 (three) times daily as needed for spasms. 06/22/15   Sharman CheekStafford, Phillip, MD  HYDROcodone-acetaminophen (NORCO/VICODIN) 5-325 MG tablet Take 1 tablet by mouth every 4 (four) hours as needed for moderate pain. 02/06/16   Tommi RumpsSummers, Rhonda L, PA-C  ibuprofen (ADVIL,MOTRIN) 600 MG tablet Take 1 tablet (600 mg total) by mouth every 8 (eight) hours as needed. 02/06/16   Tommi RumpsSummers, Rhonda L, PA-C  ondansetron (ZOFRAN ODT) 8 MG disintegrating tablet Take 1 tablet (8 mg total) by mouth every 8 (eight) hours as needed for nausea or vomiting. 06/22/15   Sharman CheekStafford, Phillip, MD     Allergies Erythromycin and Keflex [cephalexin]  No family history on file.  Social History Social History   Tobacco Use  . Smoking status: Current Every Day Smoker  . Smokeless tobacco: Never Used  Substance Use Topics  . Alcohol use: Yes  . Drug use: Not on file      Review of Systems Constitutional: No fever/chills Eyes: No visual changes. ENT: No sore throat. Cardiovascular: Positive chest pain Respiratory: Denies shortness of breath. Gastrointestinal: Positive upper abdominal pain and vomiting no diarrhea.  No constipation. Genitourinary: Negative for dysuria. Musculoskeletal: Negative for back pain. Skin: Negative for rash. Neurological: Negative for headaches, focal weakness or numbness. All other ROS negative ____________________________________________   PHYSICAL EXAM:  VITAL SIGNS: Blood pressure 118/65, pulse 72, temperature 98.4 F (36.9 C), temperature source Oral, resp. rate 16, height 5\' 5"  (1.651 m), weight 74.8 kg, SpO2 98 %.  Constitutional: Alert and oriented.  Appears to be in discomfort Eyes: Conjunctivae are normal. EOMI. Head: Atraumatic. Nose: No congestion/rhinnorhea. Mouth/Throat: Mucous membranes are moist.   Neck: No stridor. Trachea Midline. FROM Cardiovascular: Normal rate, regular rhythm. Grossly normal heart sounds.  Good peripheral circulation. Respiratory: Normal respiratory effort.  No retractions. Lungs CTAB. Gastrointestinal: Tender in the upper abdomen. No distention. No abdominal bruits.  Musculoskeletal: No lower extremity tenderness nor edema.  No joint effusions. Neurologic:  Normal speech and language. No gross focal neurologic deficits are appreciated.  Skin:  Skin is warm,  dry and intact. No rash noted. Psychiatric: Mood and affect are normal. Speech and behavior are normal. GU: Deferred   ____________________________________________   LABS (all labs ordered are listed, but only abnormal results are  displayed)  Labs Reviewed  CBC WITH DIFFERENTIAL/PLATELET - Abnormal; Notable for the following components:      Result Value   WBC 12.9 (*)    Hemoglobin 10.2 (*)    HCT 33.5 (*)    MCV 79.2 (*)    MCH 24.1 (*)    RDW 15.6 (*)    Platelets 531 (*)    Neutro Abs 9.6 (*)    All other components within normal limits  COMPREHENSIVE METABOLIC PANEL - Abnormal; Notable for the following components:   Glucose, Bld 125 (*)    BUN 25 (*)    AST 54 (*)    Alkaline Phosphatase 226 (*)    All other components within normal limits  URINALYSIS, COMPLETE (UACMP) WITH MICROSCOPIC - Abnormal; Notable for the following components:   Color, Urine STRAW (*)    APPearance CLEAR (*)    Specific Gravity, Urine >1.046 (*)    All other components within normal limits  LIPASE, BLOOD  TROPONIN I (HIGH SENSITIVITY)  TROPONIN I (HIGH SENSITIVITY)   ____________________________________________   ED ECG REPORT I, Vanessa Kenton, the attending physician, personally viewed and interpreted this ECG.  EKG is sinus rate of 67, no ST elevation, no T wave inversions ____________________________________________  RADIOLOGY   Official radiology report(s): CT Angio Chest/Abd/Pel for Dissection W and/or Wo Contrast  Result Date: 02/25/2020 CLINICAL DATA:  Chest, abdomen, and back pain. Reported intermittent hypotension EXAM: CT ANGIOGRAPHY CHEST, ABDOMEN AND PELVIS TECHNIQUE: Initially, axial CT images were obtained through the chest without intravenous contrast material administration. Multidetector CT imaging through the chest, abdomen and pelvis was performed using the standard protocol during bolus administration of intravenous contrast. Multiplanar reconstructed images and MIPs were obtained and reviewed to evaluate the vascular anatomy. CONTRAST:  169mL OMNIPAQUE IOHEXOL 350 MG/ML SOLN COMPARISON:  CT abdomen and pelvis February 22, 2013 FINDINGS: CTA CHEST FINDINGS Cardiovascular: On noncontrast enhanced study,  there is no demonstrable intramural hematoma. There is no evident mediastinal hematoma. No thoracic aortic aneurysm or dissection. Visualized great vessels appear normal without appreciable atherosclerosis, aneurysm or dissection. No appreciable plaque is seen in the thoracic aorta. There is no appreciable pericardial effusion or pericardial thickening. No demonstrable pulmonary embolus. Mediastinum/Nodes: Visualized thyroid appears unremarkable. There is no evident thoracic adenopathy the. There are no esophageal lesions appreciable. Lungs/Pleura: There is slight scarring in the extreme apices bilaterally. There is slight bibasilar atelectasis. There is no edema or airspace opacity. No pleural effusions are evident. On axial slice 96 series 7, there is a 4 mm nodular opacity in the lateral segment of the right lower lobe. On axial slice 82 series 7, there is a 4 mm nodular opacity in the right middle lobe. On axial slice 867 series 7, there is a 3 mm nodular opacity in the anterior segment of the left lower lobe. There is a nearby 2 mm nodular opacity in this area on the left seen on axial slice 619 series 7. On axial slice 66 series 7, there is a 3 mm nodular opacity in the superior segment left lower lobe. Musculoskeletal: No blastic or lytic bone lesions. No evident chest wall lesions. Review of the MIP images confirms the above findings. CTA ABDOMEN AND PELVIS FINDINGS VASCULAR Aorta: There is no abdominal aortic aneurysm or  dissection. There is minimal plaque seen within the abdominal aorta distally. Celiac: The celiac artery and its branches appear widely patent throughout their respective courses. There is tortuosity in the proximal celiac artery. No aneurysm or dissection involving these vessels. SMA: Superior mesenteric artery and respective branches appear widely patent throughout their courses. No aneurysm or dissection involving these vessels. Renals: There is a single renal artery on each side. Each  renal artery and its branches are widely patent. No aneurysm or dissection involving these vessels. No evident fibromuscular dysplasia. IMA: Inferior mesenteric artery and its branches are patent. No aneurysm or dissection involving these vessels. No appreciable atherosclerotic plaque1 seen. Inflow: The major pelvic arterial vessels appear widely patent throughout their respective courses without appreciable plaque seen. No aneurysm or dissection involving these vessels. Proximal superficial femoral and profunda femoral arteries also appear unremarkable without plaque, aneurysm, or dissection. Veins: No obvious venous abnormality within the limitations of this arterial phase study. Note that there is a retroaortic left renal vein, an anatomic variant. Review of the MIP images confirms the above findings. NON-VASCULAR Hepatobiliary: No focal liver lesions are evident. Gallbladder wall is not appreciably thickened. There is no biliary duct dilatation. Pancreas: There is no pancreatic mass or inflammatory focus. Spleen: No splenic lesions are evident. Adrenals/Urinary Tract: Adrenals bilaterally appear normal. Kidneys bilaterally show no evident mass or hydronephrosis on either side. The there is no renal or ureteral calculus on either side. The urinary bladder wall thickness is normal. There is urinary bladder prolapse, extending slightly below the level of the pubic symphysis. Stomach/Bowel: There is no appreciable bowel wall or mesenteric thickening. No evident bowel obstruction. Terminal ileum appears normal. There is no evident free air or portal venous air. There is a degree of rectal prolapse. Lymphatic: No evident adenopathy in the abdomen or pelvis. Reproductive: Uterus absent. There is evidence of vaginal prolapse. No pelvic mass evident. Other: The appendix appears normal. No evident abscess or ascites in the abdomen or pelvis. There is slight fat in the umbilicus. Musculoskeletal: No blastic or lytic bone  lesions. No intramuscular lesions evident. Review of the MIP images confirms the above findings. IMPRESSION: CT angiogram chest: 1. No demonstrable thoracic aortic aneurysm or dissection. No appreciable atherosclerotic plaque within the major thoracic arterial vessels. 2. Scattered 2-4 mm nodular opacities bilaterally. Areas of mild atelectatic change. No edema or airspace opacity. No follow-up needed if patient is low-risk (and has no known or suspected primary neoplasm). Non-contrast chest CT can be considered in 12 months if patient is high-risk. This recommendation follows the consensus statement: Guidelines for Management of Incidental Pulmonary Nodules Detected on CT Images: From the Fleischner Society 2017; Radiology 2017; 284:228-243. 3.  No evident thoracic adenopathy. CT angiogram abdomen; CT angiogram pelvis: 1. Minimal atherosclerotic plaque in the distal aorta. No other atherosclerotic plaque noted in the aorta, major pelvic, and major mesenteric arterial vessels. No aneurysm or dissection involving the aorta, major mesenteric, or major pelvic arterial vessels. No fibromuscular dysplasia evident. 2. There is urinary bladder, vaginal, and rectal prolapse. Uterus absent. 3. No bowel obstruction. No abscess in the abdomen or pelvis. Appendix appears normal. 4. No hydronephrosis. No renal or ureteral calculus on either side. Electronically Signed   By: Bretta Bang III M.D.   On: 02/25/2020 10:54   US ABDOMEN LIMITED RUQ  Result Date: 02/25/2020 CLINICAL DATA:  Right upper quadrant pain EXAM: ULTRASOUND ABDOMEN LIMITED RIGHT UPPER QUADRANT COMPARISON:  CT 02/25/2020 FINDINGS: Gallbladder: No gallstones or wall thickening  visualized. No sonographic Murphy sign noted by sonographer. Common bile duct: Diameter: 5 mm Liver: No focal lesion identified. Heterogeneous echotexture with diffusely increased parenchymal echogenicity. Portal vein is patent on color Doppler imaging with normal direction of blood  flow towards the liver. Other: None. IMPRESSION: 1. Heterogeneous, echogenic appearance of the liver. This is a nonspecific finding but is most commonly seen with fatty infiltration of the liver. Correlate with liver function tests. There are no obvious focal liver lesions. 2. Unremarkable sonographic appearance of the gallbladder. Electronically Signed   By: Duanne Guess D.O.   On: 02/25/2020 12:35    ____________________________________________   PROCEDURES  Procedure(s) performed (including Critical Care):  .1-3 Lead EKG Interpretation Performed by: Concha Se, MD Authorized by: Concha Se, MD     Interpretation: normal     ECG rate:  70s    ECG rate assessment: normal     Rhythm: sinus rhythm     Ectopy: none     Conduction: normal       ____________________________________________   INITIAL IMPRESSION / ASSESSMENT AND PLAN / ED COURSE   SHALEN PETRAK was evaluated in Emergency Department on 02/25/2020 for the symptoms described in the history of present illness. She was evaluated in the context of the global COVID-19 pandemic, which necessitated consideration that the patient might be at risk for infection with the SARS-CoV-2 virus that causes COVID-19. Institutional protocols and algorithms that pertain to the evaluation of patients at risk for COVID-19 are in a state of rapid change based on information released by regulatory bodies including the CDC and federal and state organizations. These policies and algorithms were followed during the patient's care in the ED.    Patient is a 54 year old who initially was hypotensive who comes in with chest and abdominal pain that is severe radiating to the back.  Most concerned about dissection.  Will get patient to the CT scanner.  This also could be gallbladder pathology but will want to rule out dissection first given extent of patient's pain.  Patient had normal Creatinine 1 year ago and states that she is been eating and  drinking normally recently.  Discussed with patient the benefits and risk of waiting for the kidney function and she is okay with proceeding with CT without the kidney function back yet. Also get labs to evaluate for ACS.  DDx that was also considered d/t potential to cause harm, but was found less likely based on history and physical (as detailed above): -PNA (no fevers, cough but CXR to evaluate) -PNX (reassured with equal b/l breath sounds, CXR to evaluate) -Symptomatic anemia (will get H&H) -Pulmonary embolism as no sob at rest, not pleuritic in nature, no hypoxia -Pericarditis no rub on exam, EKG changes or hx to suggest dx -Tamponade (no notable SOB, tachycardic, hypotensive) -Esophageal rupture (no h/o diffuse vomitting/no crepitus)  CT imaging was negative.  Did have some incidental findings that were verbally told to patient and a copy of report was given.  Given the upper abdominal pain will just make sure there is no evidence of gallbladder issues with the ultrasound  Ultrasound is negative, cardiac markers are negative x2 making ACS less likely.  Discussed with patient and her blood pressures are little soft and she stated that she feels weak and still having little bit of chest discomfort.  We will give some Tylenol, GI cocktail, 1 L of fluid.  While patient is getting this we will just get a third troponin  given her symptoms started right when she presented to the ER.  Discussed with patient that we could admit her for chest pain rule out, trending cardiac markers, echo versus patient going home and following up outpatient with cardiology for heart score of 3-4.Marland Kitchen Patient stated she want to think about it and see how she felt after the treatments.  Patient had off to oncoming team pending treatments and reevaluation.       ____________________________________________   FINAL CLINICAL IMPRESSION(S) / ED DIAGNOSES   Final diagnoses:  RUQ pain     MEDICATIONS GIVEN  DURING THIS VISIT:  Medications  HYDROmorphone (DILAUDID) injection 0.5 mg (0.5 mg Intravenous Given 02/25/20 1018)  ondansetron (ZOFRAN) injection 4 mg (4 mg Intravenous Given 02/25/20 1018)  iohexol (OMNIPAQUE) 350 MG/ML injection 100 mL (100 mLs Intravenous Contrast Given 02/25/20 1030)  HYDROmorphone (DILAUDID) injection 0.5 mg (0.5 mg Intravenous Given 02/25/20 1236)  sodium chloride 0.9 % bolus 500 mL (0 mLs Intravenous Stopped 02/25/20 1525)  alum & mag hydroxide-simeth (MAALOX/MYLANTA) 200-200-20 MG/5ML suspension 30 mL (30 mLs Oral Given 02/25/20 1522)    And  lidocaine (XYLOCAINE) 2 % viscous mouth solution 15 mL (15 mLs Oral Given 02/25/20 1523)  acetaminophen (TYLENOL) tablet 1,000 mg (1,000 mg Oral Given 02/25/20 1520)  ketorolac (TORADOL) 30 MG/ML injection 15 mg (15 mg Intravenous Given 02/25/20 1521)  sodium chloride 0.9 % bolus 1,000 mL (1,000 mLs Intravenous New Bag/Given 02/25/20 1520)     ED Discharge Orders    None       Note:  This document was prepared using Dragon voice recognition software and may include unintentional dictation errors.   Concha Se, MD 02/25/20 [phone]

## 2020-02-25 NOTE — ED Notes (Signed)
ED Provider at bedside. 

## 2020-02-25 NOTE — Consult Note (Signed)
Alexandria Larson is a 54 y.o. female  [id number]  Primary Cardiologist: Adrian Blackwater Reason for Consultation: Chest pain  HPI: This is a 54 year old pleasant white female who presented to the hospital with chest pain described as an elephant sitting on the chest being 10 out of 10 and woke her up.  Right now she is eating dinner and she is still having 4 out of 10 chest pain.  But talking to her she appears to be quite comfortable and is not diaphoretic or short of breath.   Review of Systems: No orthopnea PND or leg swelling   Past Medical History:  Diagnosis Date  . Hypertension     (Not in a hospital admission)    . [START ON 02/26/2020] aspirin EC  81 mg Oral Daily  . atorvastatin  20 mg Oral Daily  . enoxaparin (LOVENOX) injection  40 mg Subcutaneous Q24H    Infusions:   Allergies  Allergen Reactions  . Erythromycin   . Keflex [Cephalexin] Rash    Social History   Socioeconomic History  . Marital status: Married    Spouse name: Not on file  . Number of children: Not on file  . Years of education: Not on file  . Highest education level: Not on file  Occupational History  . Not on file  Tobacco Use  . Smoking status: Current Every Day Smoker  . Smokeless tobacco: Never Used  Substance and Sexual Activity  . Alcohol use: Yes  . Drug use: Not on file  . Sexual activity: Not on file  Other Topics Concern  . Not on file  Social History Narrative  . Not on file   Social Determinants of Health   Financial Resource Strain:   . Difficulty of Paying Living Expenses:   Food Insecurity:   . Worried About Programme researcher, broadcasting/film/video in the Last Year:   . Barista in the Last Year:   Transportation Needs:   . Freight forwarder (Medical):   Marland Kitchen Lack of Transportation (Non-Medical):   Physical Activity:   . Days of Exercise per Week:   . Minutes of Exercise per Session:   Stress:   . Feeling of Stress :   Social Connections:   . Frequency of Communication  with Friends and Family:   . Frequency of Social Gatherings with Friends and Family:   . Attends Religious Services:   . Active Member of Clubs or Organizations:   . Attends Banker Meetings:   Marland Kitchen Marital Status:   Intimate Partner Violence:   . Fear of Current or Ex-Partner:   . Emotionally Abused:   Marland Kitchen Physically Abused:   . Sexually Abused:     History reviewed. No pertinent family history.  PHYSICAL EXAM: Vitals:   02/25/20 1700 02/25/20 1730  BP: 117/63 104/67  Pulse: 77 74  Resp: 17 16  Temp:    SpO2: 98% 98%    No intake or output data in the 24 hours ending 02/25/20 1822  General:  Well appearing. No respiratory difficulty HEENT: normal Neck: supple. no JVD. Carotids 2+ bilat; no bruits. No lymphadenopathy or thryomegaly appreciated. Cor: PMI nondisplaced. Regular rate & rhythm. No rubs, gallops or murmurs. Lungs: clear Abdomen: soft, nontender, nondistended. No hepatosplenomegaly. No bruits or masses. Good bowel sounds. Extremities: no cyanosis, clubbing, rash, edema Neuro: alert & oriented x 3, cranial nerves grossly intact. moves all 4 extremities w/o difficulty. Affect pleasant.  ECG: Normal sinus rhythm  left atrial enlargement no acute changes  Results for orders placed or performed during the hospital encounter of 02/25/20 (from the past 24 hour(s))  CBC with Differential     Status: Abnormal   Collection Time: 02/25/20 10:06 AM  Result Value Ref Range   WBC 12.9 (H) 4.0 - 10.5 K/uL   RBC 4.23 3.87 - 5.11 MIL/uL   Hemoglobin 10.2 (L) 12.0 - 15.0 g/dL   HCT 16.133.5 (L) 09.636.0 - 04.546.0 %   MCV 79.2 (L) 80.0 - 100.0 fL   MCH 24.1 (L) 26.0 - 34.0 pg   MCHC 30.4 30.0 - 36.0 g/dL   RDW 40.915.6 (H) 81.111.5 - 91.415.5 %   Platelets 531 (H) 150 - 400 K/uL   nRBC 0.0 0.0 - 0.2 %   Neutrophils Relative % 75 %   Neutro Abs 9.6 (H) 1.7 - 7.7 K/uL   Lymphocytes Relative 16 %   Lymphs Abs 2.1 0.7 - 4.0 K/uL   Monocytes Relative 7 %   Monocytes Absolute 0.9 0.1 - 1.0  K/uL   Eosinophils Relative 1 %   Eosinophils Absolute 0.2 0.0 - 0.5 K/uL   Basophils Relative 0 %   Basophils Absolute 0.0 0.0 - 0.1 K/uL   Immature Granulocytes 1 %   Abs Immature Granulocytes 0.06 0.00 - 0.07 K/uL  Comprehensive metabolic panel     Status: Abnormal   Collection Time: 02/25/20 10:06 AM  Result Value Ref Range   Sodium 138 135 - 145 mmol/L   Potassium 3.6 3.5 - 5.1 mmol/L   Chloride 99 98 - 111 mmol/L   CO2 27 22 - 32 mmol/L   Glucose, Bld 125 (H) 70 - 99 mg/dL   BUN 25 (H) 6 - 20 mg/dL   Creatinine, Ser 7.820.80 0.44 - 1.00 mg/dL   Calcium 9.2 8.9 - 95.610.3 mg/dL   Total Protein 7.8 6.5 - 8.1 g/dL   Albumin 4.3 3.5 - 5.0 g/dL   AST 54 (H) 15 - 41 U/L   ALT 32 0 - 44 U/L   Alkaline Phosphatase 226 (H) 38 - 126 U/L   Total Bilirubin 0.6 0.3 - 1.2 mg/dL   GFR calc non Af Amer >60 >60 mL/min   GFR calc Af Amer >60 >60 mL/min   Anion gap 12 5 - 15  Lipase, blood     Status: None   Collection Time: 02/25/20 10:06 AM  Result Value Ref Range   Lipase 46 11 - 51 U/L  Troponin I (High Sensitivity)     Status: None   Collection Time: 02/25/20 10:06 AM  Result Value Ref Range   Troponin I (High Sensitivity) 4 <18 ng/L  Troponin I (High Sensitivity)     Status: None   Collection Time: 02/25/20 12:06 PM  Result Value Ref Range   Troponin I (High Sensitivity) 5 <18 ng/L  Urinalysis, Complete w Microscopic     Status: Abnormal   Collection Time: 02/25/20 12:37 PM  Result Value Ref Range   Color, Urine STRAW (A) YELLOW   APPearance CLEAR (A) CLEAR   Specific Gravity, Urine >1.046 (H) 1.005 - 1.030   pH 6.0 5.0 - 8.0   Glucose, UA NEGATIVE NEGATIVE mg/dL   Hgb urine dipstick NEGATIVE NEGATIVE   Bilirubin Urine NEGATIVE NEGATIVE   Ketones, ur NEGATIVE NEGATIVE mg/dL   Protein, ur NEGATIVE NEGATIVE mg/dL   Nitrite NEGATIVE NEGATIVE   Leukocytes,Ua NEGATIVE NEGATIVE   RBC / HPF 0-5 0 - 5 RBC/hpf   WBC,  UA 0-5 0 - 5 WBC/hpf   Bacteria, UA NONE SEEN NONE SEEN   Squamous  Epithelial / LPF NONE SEEN 0 - 5  CK     Status: None   Collection Time: 02/25/20 12:37 PM  Result Value Ref Range   Total CK 91 38 - 234 U/L  Urine Drug Screen, Qualitative ([facility] only)     Status: None   Collection Time: 02/25/20 12:37 PM  Result Value Ref Range   Tricyclic, Ur Screen NONE DETECTED NONE DETECTED   Amphetamines, Ur Screen NONE DETECTED NONE DETECTED   MDMA (Ecstasy)Ur Screen NONE DETECTED NONE DETECTED   Cocaine Metabolite,Ur [place] NONE DETECTED NONE DETECTED   Opiate, Ur Screen NONE DETECTED NONE DETECTED   Phencyclidine (PCP) Ur S NONE DETECTED NONE DETECTED   Cannabinoid 50 Ng, Ur [place] NONE DETECTED NONE DETECTED   Barbiturates, Ur Screen NONE DETECTED NONE DETECTED   Benzodiazepine, Ur Scrn NONE DETECTED NONE DETECTED   Methadone Scn, Ur NONE DETECTED NONE DETECTED   CT Angio Chest/Abd/Pel for Dissection W and/or Wo Contrast  Result Date: 02/25/2020 CLINICAL DATA:  Chest, abdomen, and back pain. Reported intermittent hypotension EXAM: CT ANGIOGRAPHY CHEST, ABDOMEN AND PELVIS TECHNIQUE: Initially, axial CT images were obtained through the chest without intravenous contrast material administration. Multidetector CT imaging through the chest, abdomen and pelvis was performed using the standard protocol during bolus administration of intravenous contrast. Multiplanar reconstructed images and MIPs were obtained and reviewed to evaluate the vascular anatomy. CONTRAST:  OMNIPAQUE IOHEXOL 350 MG/ML SOLN COMPARISON:  CT abdomen and pelvis February 22, 2013 FINDINGS: CTA CHEST FINDINGS Cardiovascular: On noncontrast enhanced study, there is no demonstrable intramural hematoma. There is no evident mediastinal hematoma. No thoracic aortic aneurysm or dissection. Visualized great vessels appear normal without appreciable atherosclerosis, aneurysm or dissection. No appreciable plaque is seen in the thoracic aorta. There is no appreciable pericardial effusion or pericardial thickening. No  demonstrable pulmonary embolus. Mediastinum/Nodes: Visualized thyroid appears unremarkable. There is no evident thoracic adenopathy the. There are no esophageal lesions appreciable. Lungs/Pleura: There is slight scarring in the extreme apices bilaterally. There is slight bibasilar atelectasis. There is no edema or airspace opacity. No pleural effusions are evident. On axial slice 96 series 7, there is a 4 mm nodular opacity in the lateral segment of the right lower lobe. On axial slice 82 series 7, there is a 4 mm nodular opacity in the right middle lobe. On axial slice 100 series 7, there is a 3 mm nodular opacity in the anterior segment of the left lower lobe. There is a nearby 2 mm nodular opacity in this area on the left seen on axial slice 102 series 7. On axial slice 66 series 7, there is a 3 mm nodular opacity in the superior segment left lower lobe. Musculoskeletal: No blastic or lytic bone lesions. No evident chest wall lesions. Review of the MIP images confirms the above findings. CTA ABDOMEN AND PELVIS FINDINGS VASCULAR Aorta: There is no abdominal aortic aneurysm or dissection. There is minimal plaque seen within the abdominal aorta distally. Celiac: The celiac artery and its branches appear widely patent throughout their respective courses. There is tortuosity in the proximal celiac artery. No aneurysm or dissection involving these vessels. SMA: Superior mesenteric artery and respective branches appear widely patent throughout their courses. No aneurysm or dissection involving these vessels. Renals: There is a single renal artery on each side. Each renal artery and its branches are widely patent. No aneurysm or dissection  involving these vessels. No evident fibromuscular dysplasia. IMA: Inferior mesenteric artery and its branches are patent. No aneurysm or dissection involving these vessels. No appreciable atherosclerotic plaque1 seen. Inflow: The major pelvic arterial vessels appear widely patent  throughout their respective courses without appreciable plaque seen. No aneurysm or dissection involving these vessels. Proximal superficial femoral and profunda femoral arteries also appear unremarkable without plaque, aneurysm, or dissection. Veins: No obvious venous abnormality within the limitations of this arterial phase study. Note that there is a retroaortic left renal vein, an anatomic variant. Review of the MIP images confirms the above findings. NON-VASCULAR Hepatobiliary: No focal liver lesions are evident. Gallbladder wall is not appreciably thickened. There is no biliary duct dilatation. Pancreas: There is no pancreatic mass or inflammatory focus. Spleen: No splenic lesions are evident. Adrenals/Urinary Tract: Adrenals bilaterally appear normal. Kidneys bilaterally show no evident mass or hydronephrosis on either side. The there is no renal or ureteral calculus on either side. The urinary bladder wall thickness is normal. There is urinary bladder prolapse, extending slightly below the level of the pubic symphysis. Stomach/Bowel: There is no appreciable bowel wall or mesenteric thickening. No evident bowel obstruction. Terminal ileum appears normal. There is no evident free air or portal venous air. There is a degree of rectal prolapse. Lymphatic: No evident adenopathy in the abdomen or pelvis. Reproductive: Uterus absent. There is evidence of vaginal prolapse. No pelvic mass evident. Other: The appendix appears normal. No evident abscess or ascites in the abdomen or pelvis. There is slight fat in the umbilicus. Musculoskeletal: No blastic or lytic bone lesions. No intramuscular lesions evident. Review of the MIP images confirms the above findings. IMPRESSION: CT angiogram chest: 1. No demonstrable thoracic aortic aneurysm or dissection. No appreciable atherosclerotic plaque within the major thoracic arterial vessels. 2. Scattered 2-4 mm nodular opacities bilaterally. Areas of mild atelectatic change. No  edema or airspace opacity. No follow-up needed if patient is low-risk (and has no known or suspected primary neoplasm). Non-contrast chest CT can be considered in 12 months if patient is high-risk. This recommendation follows the consensus statement: Guidelines for Management of Incidental Pulmonary Nodules Detected on CT Images: From the Fleischner Society 2017; Radiology 2017; 284:228-243. 3.  No evident thoracic adenopathy. CT angiogram abdomen; CT angiogram pelvis: 1. Minimal atherosclerotic plaque in the distal aorta. No other atherosclerotic plaque noted in the aorta, major pelvic, and major mesenteric arterial vessels. No aneurysm or dissection involving the aorta, major mesenteric, or major pelvic arterial vessels. No fibromuscular dysplasia evident. 2. There is urinary bladder, vaginal, and rectal prolapse. Uterus absent. 3. No bowel obstruction. No abscess in the abdomen or pelvis. Appendix appears normal. 4. No hydronephrosis. No renal or ureteral calculus on either side. Electronically Signed   By: Lowella Grip III M.D.   On: 02/25/2020 10:54   US ABDOMEN LIMITED RUQ  Result Date: 02/25/2020 CLINICAL DATA:  Right upper quadrant pain EXAM: ULTRASOUND ABDOMEN LIMITED RIGHT UPPER QUADRANT COMPARISON:  CT 02/25/2020 FINDINGS: Gallbladder: No gallstones or wall thickening visualized. No sonographic Murphy sign noted by sonographer. Common bile duct: Diameter: 5 mm Liver: No focal lesion identified. Heterogeneous echotexture with diffusely increased parenchymal echogenicity. Portal vein is patent on color Doppler imaging with normal direction of blood flow towards the liver. Other: None. IMPRESSION: 1. Heterogeneous, echogenic appearance of the liver. This is a nonspecific finding but is most commonly seen with fatty infiltration of the liver. Correlate with liver function tests. There are no obvious focal liver lesions. 2. Unremarkable  sonographic appearance of the gallbladder. Electronically Signed    By: Duanne Guess D.O.   On: 02/25/2020 12:35     ASSESSMENT AND PLAN: Atypical chest pain with 2 sets of cardiac enzymes and EKG being normal and no sign of any acute distress with the patient while having chest pain.  I do not feel necessary that any work-up in the hospital is needed and after the third set of enzymes are negative and in the morning patient can be discharged with follow-up 845 in my office on Monday morning and will do further work-up.  Thank you very much for referral  Levina Boyack A

## 2020-02-26 ENCOUNTER — Observation Stay
Admission: EM | Admit: 2020-02-26 | Discharge: 2020-02-26 | Disposition: A | Discharge status: Home / Self Care | Payer: 59 | Source: Home / Self Care | Attending: Internal Medicine | Admitting: Internal Medicine

## 2020-02-26 DIAGNOSIS — D509 Iron deficiency anemia, unspecified: Secondary | ICD-10-CM

## 2020-02-26 DIAGNOSIS — E119 Type 2 diabetes mellitus without complications: Secondary | ICD-10-CM

## 2020-02-26 DIAGNOSIS — Z720 Tobacco use: Secondary | ICD-10-CM | POA: Diagnosis not present

## 2020-02-26 DIAGNOSIS — I10 Essential (primary) hypertension: Secondary | ICD-10-CM | POA: Diagnosis not present

## 2020-02-26 DIAGNOSIS — R079 Chest pain, unspecified: Secondary | ICD-10-CM

## 2020-02-26 LAB — LIPID PANEL
Cholesterol: 156 mg/dL (ref 0–200)
HDL: 42 mg/dL (ref >40)
LDL Cholesterol: 81 mg/dL (ref 0–99)
Total CHOL/HDL Ratio: 3.7 RATIO
Triglycerides: 164 mg/dL — ABNORMAL HIGH (ref <150)
VLDL: 33 mg/dL (ref 0–40)

## 2020-02-26 LAB — ECHOCARDIOGRAM COMPLETE
Height: 65 in
Weight: 2640 oz

## 2020-02-26 LAB — HEMOGLOBIN A1C
Hgb A1c MFr Bld: 6.6 % — ABNORMAL HIGH (ref 4.8–5.6)
Mean Plasma Glucose: 142.72 mg/dL

## 2020-02-26 MED ORDER — ACETAMINOPHEN 325 MG PO TABS: 650.0000 mg | ORAL_TABLET | Freq: Four times a day (QID) | ORAL | 0 refills | Status: AC | PRN

## 2020-02-26 MED ORDER — PERFLUTREN LIPID MICROSPHERE
1.0000 mL | INTRAVENOUS | Status: AC | PRN
  Administered 2020-02-26: 3 mL via INTRAVENOUS
  Filled 2020-02-26: qty 10

## 2020-02-26 NOTE — Plan of Care (Signed)
Discharge teaching completed with patient who verbalized understanding of teaching and follow-up information.  Discharged in stable condition and with all belongings.

## 2020-02-26 NOTE — Progress Notes (Signed)
*  PRELIMINARY RESULTS* Echocardiogram 2D Echocardiogram has been performed. Definity IV Contrast used on this study.  Alexandria Larson Garfield Coiner 02/26/2020, 9:29 AM

## 2020-02-26 NOTE — Progress Notes (Signed)
SUBJECTIVE: Patient is feeling much better has no further chest pain and wants to go home.   Vitals:   02/25/20 1850 02/25/20 2004 02/26/20 0408 02/26/20 0643  BP: 115/60 110/63 (!) 81/43 108/62  Pulse: 70 77 78 80  Resp: 20 16 16    Temp: 98.3 F (36.8 C) 97.7 F (36.5 C) (!) 97.3 F (36.3 C)   TempSrc: Oral Oral Oral   SpO2: 99% 96% 96%   Weight:      Height:       No intake or output data in the 24 hours ending 02/26/20 0842  LABS: Basic Metabolic Panel: Recent Labs    02/25/20 1006  NA 138  K 3.6  CL 99  CO2 27  GLUCOSE 125*  BUN 25*  CREATININE 0.80  CALCIUM 9.2   Liver Function Tests: Recent Labs    02/25/20 1006  AST 54*  ALT 32  ALKPHOS 226*  BILITOT 0.6  PROT 7.8  ALBUMIN 4.3   Recent Labs    02/25/20 1006  LIPASE 46   CBC: Recent Labs    02/25/20 1006  WBC 12.9*  NEUTROABS 9.6*  HGB 10.2*  HCT 33.5*  MCV 79.2*  PLT 531*   Cardiac Enzymes: Recent Labs    02/25/20 1237  CKTOTAL 91   BNP: Invalid input(s): POCBNP D-Dimer: No results for input(s): DDIMER in the last 72 hours. Hemoglobin A1C: Recent Labs    02/25/20 1237  HGBA1C 6.6*   Fasting Lipid Panel: Recent Labs    02/26/20 0453  CHOL 156  HDL 42  LDLCALC 81  TRIG 164*  CHOLHDL 3.7   Thyroid Function Tests: No results for input(s): TSH, T4TOTAL, T3FREE, THYROIDAB in the last 72 hours.  Invalid input(s): FREET3 Anemia Panel: No results for input(s): VITAMINB12, FOLATE, FERRITIN, TIBC, IRON, RETICCTPCT in the last 72 hours.   PHYSICAL EXAM General: Well developed, well nourished, in no acute distress HEENT:  Normocephalic and atramatic Neck:  No JVD.  Lungs: Clear bilaterally to auscultation and percussion. Heart: HRRR . Normal S1 and S2 without gallops or murmurs.  Abdomen: Bowel sounds are positive, abdomen soft and non-tender  Msk:  Back normal, normal gait. Normal strength and tone for age. Extremities: No clubbing, cyanosis or edema.   Neuro: Alert and  oriented X 3. Psych:  Good affect, responds appropriately  TELEMETRY: Sinus rhythm  ASSESSMENT AND PLAN: Ruled out for myocardial infarction has atypical chest pain.  We will see the patient at 8:45 AM on Monday and will do further work-up such as CTA coronaries.  Patient can be discharged with follow-up in the office at 845 Monday.  Active Problems:   Chest pain at rest   Essential hypertension   Tobacco abuse    Friday, MD, [facility] 02/26/2020 8:42 AM

## 2020-02-26 NOTE — Discharge Summary (Signed)
Physician Discharge Summary  Alexandria Larson YTK:[id number] DOB: 1966-06-16 DOA: 02/25/2020  PCP: [facility]  Admit date: 02/25/2020 Discharge date: 02/26/2020  Admitted From: home Disposition:  home  Recommendations for Outpatient Follow-up:  Follow up cardiology Dr Welton Flakes in 2 days  Home Health:none Equipment/Devices:none  Discharge Condition fair CODE STATUS:full Diet recommendation: Heart Healthy / Carb Modified   Discharge Diagnoses:  principle Problems:   Chest pain at rest   Active problem   Essential hypertension   Tobacco abuse   Type 2 diabetes mellitus without complication [facility])  Brief narrative 54 y.o. female, with history of hypertension, hyperlipidemia, active smoker who presented to the ED with acute onset of sharp substernal chest pain radiating to the back since this morning.  Pain was 10/10 in severity without any aggravating or relieving factors.  Patient reports having pain shortly after waking up.  Denies any shortness of breath, nausea, vomiting, palpitations, headache, blurred vision, dizziness, fevers, chills, abdominal pain, diarrhea, dysuria, tingling or numbness of extremities. Denies lifting heavy weight or chest trauma.  Denies similar symptoms in the past.  Denies any sick contact, recent illness or exposure to COVID-19. Reports smoking about half pack per day and occasional marijuana use.  Also reports having a few drinks in a week but denies any cocaine or other illicit drug use.  In the ED vitals were stable.  Blood work showed WBC of 12.9, hemoglobin of 10.2 with low MCV, platelets of 531, normal chemistry including LFTs except for blood glucose of 125, high sensitive troponin x2 normal, normal UA and normal lipase. EKG showed normal sinus rhythm at 67 with no ST-T changes. ED physician ordered CT angiogram of the chest with concern for aortic dissection and was negative for dissection or PE. Ultrasound of the abdomen also done to  rule out acute cholecystitis or pancreatitis and was unremarkable. In the ED she was given 1.5 L normal saline bolus, Dilaudid 0.5 mg x 2, Toradol 15 mg x 1 and Maalox however patient still complaining of 10/10 pain in the chest as if an elephant was sitting on her chest. Given persistent symptoms hospitalist consulted for observation to telemetry.   Hospital course  principle Problems:   Chest pain at rest Suspect musculoskeletal given reproducible pain.  However given her age, hypertension, hyperlipidemia and ongoing tobacco use and persistent pain observed overnight for ACS rule out.Troponin x2 was negative.CK, UDS negative. EKG  unremarkable.  echo done and result can be followed as outpt  patient's pain much better today. Reports 2/10. Seen by cardiologist Dr Welton Flakes and recommends outpt follow up in office in 2 days.   Consulted cardiology (Dr. Welton Flakes) who will see the patient.    Essential hypertension Resume home meds    Tobacco abuse Counseled strongly on smoking cessation. declined nicotine patch  Hyperlipidemia Continue statin.   lipid panel stable  New onset diabetes mellitus type 2 without complication  new diagnosis, A1C of 6.7. cbg stable. Recommend diet control and exercise ( counseled) for now and outpt follow up. Reading material provided on meal planning.  Microcytic anemia Suspect iron deficiency.  Recommend outpatient work-up.   procedure  CTA, [place] abd 2d echo  Consult: shaukat khan ( cardiology)  Disposition: home   Discharge Instructions   Allergies as of 02/26/2020      Reactions   Erythromycin    Keflex [cephalexin] Rash      Medication List    STOP taking these medications   amLODipine 5 MG  tablet Commonly known as: NORVASC   dicyclomine 20 MG tablet Commonly known as: Bentyl   HYDROcodone-acetaminophen 5-325 MG tablet Commonly known as: NORCO/VICODIN   ibuprofen 600 MG tablet Commonly known as: ADVIL   ondansetron 8 MG  disintegrating tablet Commonly known as: Zofran ODT     TAKE these medications   acetaminophen 325 MG tablet Commonly known as: Tylenol Take 2 tablets (650 mg total) by mouth every 6 (six) hours as needed for up to 5 days for mild pain.   atorvastatin 20 MG tablet Commonly known as: LIPITOR Take 20 mg by mouth daily.   furosemide 40 MG tablet Commonly known as: LASIX Take 40 mg by mouth 2 (two) times daily.   lisinopril-hydrochlorothiazide 10-12.5 MG tablet Commonly known as: ZESTORETIC Take 1 tablet by mouth daily.      Follow-up Information    Schedule an appointment as soon as possible for a visit  with Laurier Nancy, MD.   Specialty: Cardiology Contact information: [address] [place] [address] [phone]        [facility] Follow up in 2 week(s).   Specialty: General Practice Contact information: [address] [place] [address] [phone]          Allergies  Allergen Reactions  . Erythromycin   . Keflex [Cephalexin] Rash      Procedures/Studies: CT Angio Chest/Abd/Pel for Dissection W and/or Wo Contrast  Result Date: 02/25/2020 CLINICAL DATA:  Chest, abdomen, and back pain. Reported intermittent hypotension EXAM: CT ANGIOGRAPHY CHEST, ABDOMEN AND PELVIS TECHNIQUE: Initially, axial CT images were obtained through the chest without intravenous contrast material administration. Multidetector CT imaging through the chest, abdomen and pelvis was performed using the standard protocol during bolus administration of intravenous contrast. Multiplanar reconstructed images and MIPs were obtained and reviewed to evaluate the vascular anatomy. CONTRAST:  OMNIPAQUE IOHEXOL 350 MG/ML SOLN COMPARISON:  CT abdomen and pelvis February 22, 2013 FINDINGS: CTA CHEST FINDINGS Cardiovascular: On noncontrast enhanced study, there is no demonstrable intramural hematoma. There is no evident mediastinal hematoma. No thoracic aortic  aneurysm or dissection. Visualized great vessels appear normal without appreciable atherosclerosis, aneurysm or dissection. No appreciable plaque is seen in the thoracic aorta. There is no appreciable pericardial effusion or pericardial thickening. No demonstrable pulmonary embolus. Mediastinum/Nodes: Visualized thyroid appears unremarkable. There is no evident thoracic adenopathy the. There are no esophageal lesions appreciable. Lungs/Pleura: There is slight scarring in the extreme apices bilaterally. There is slight bibasilar atelectasis. There is no edema or airspace opacity. No pleural effusions are evident. On axial slice 96 series 7, there is a 4 mm nodular opacity in the lateral segment of the right lower lobe. On axial slice 82 series 7, there is a 4 mm nodular opacity in the right middle lobe. On axial slice 100 series 7, there is a 3 mm nodular opacity in the anterior segment of the left lower lobe. There is a nearby 2 mm nodular opacity in this area on the left seen on axial slice 102 series 7. On axial slice 66 series 7, there is a 3 mm nodular opacity in the superior segment left lower lobe. Musculoskeletal: No blastic or lytic bone lesions. No evident chest wall lesions. Review of the MIP images confirms the above findings. CTA ABDOMEN AND PELVIS FINDINGS VASCULAR Aorta: There is no abdominal aortic aneurysm or dissection. There is minimal plaque seen within the abdominal aorta distally. Celiac: The celiac artery and its branches appear widely patent throughout  their respective courses. There is tortuosity in the proximal celiac artery. No aneurysm or dissection involving these vessels. SMA: Superior mesenteric artery and respective branches appear widely patent throughout their courses. No aneurysm or dissection involving these vessels. Renals: There is a single renal artery on each side. Each renal artery and its branches are widely patent. No aneurysm or dissection involving these vessels. No  evident fibromuscular dysplasia. IMA: Inferior mesenteric artery and its branches are patent. No aneurysm or dissection involving these vessels. No appreciable atherosclerotic plaque1 seen. Inflow: The major pelvic arterial vessels appear widely patent throughout their respective courses without appreciable plaque seen. No aneurysm or dissection involving these vessels. Proximal superficial femoral and profunda femoral arteries also appear unremarkable without plaque, aneurysm, or dissection. Veins: No obvious venous abnormality within the limitations of this arterial phase study. Note that there is a retroaortic left renal vein, an anatomic variant. Review of the MIP images confirms the above findings. NON-VASCULAR Hepatobiliary: No focal liver lesions are evident. Gallbladder wall is not appreciably thickened. There is no biliary duct dilatation. Pancreas: There is no pancreatic mass or inflammatory focus. Spleen: No splenic lesions are evident. Adrenals/Urinary Tract: Adrenals bilaterally appear normal. Kidneys bilaterally show no evident mass or hydronephrosis on either side. The there is no renal or ureteral calculus on either side. The urinary bladder wall thickness is normal. There is urinary bladder prolapse, extending slightly below the level of the pubic symphysis. Stomach/Bowel: There is no appreciable bowel wall or mesenteric thickening. No evident bowel obstruction. Terminal ileum appears normal. There is no evident free air or portal venous air. There is a degree of rectal prolapse. Lymphatic: No evident adenopathy in the abdomen or pelvis. Reproductive: Uterus absent. There is evidence of vaginal prolapse. No pelvic mass evident. Other: The appendix appears normal. No evident abscess or ascites in the abdomen or pelvis. There is slight fat in the umbilicus. Musculoskeletal: No blastic or lytic bone lesions. No intramuscular lesions evident. Review of the MIP images confirms the above findings.  IMPRESSION: CT angiogram chest: 1. No demonstrable thoracic aortic aneurysm or dissection. No appreciable atherosclerotic plaque within the major thoracic arterial vessels. 2. Scattered 2-4 mm nodular opacities bilaterally. Areas of mild atelectatic change. No edema or airspace opacity. No follow-up needed if patient is low-risk (and has no known or suspected primary neoplasm). Non-contrast chest CT can be considered in 12 months if patient is high-risk. This recommendation follows the consensus statement: Guidelines for Management of Incidental Pulmonary Nodules Detected on CT Images: From the Fleischner Society 2017; Radiology 2017; 284:228-243. 3.  No evident thoracic adenopathy. CT angiogram abdomen; CT angiogram pelvis: 1. Minimal atherosclerotic plaque in the distal aorta. No other atherosclerotic plaque noted in the aorta, major pelvic, and major mesenteric arterial vessels. No aneurysm or dissection involving the aorta, major mesenteric, or major pelvic arterial vessels. No fibromuscular dysplasia evident. 2. There is urinary bladder, vaginal, and rectal prolapse. Uterus absent. 3. No bowel obstruction. No abscess in the abdomen or pelvis. Appendix appears normal. 4. No hydronephrosis. No renal or ureteral calculus on either side. Electronically Signed   By: Bretta BangWilliam  Woodruff III M.D.   On: 02/25/2020 10:54   US ABDOMEN LIMITED RUQ  Result Date: 02/25/2020 CLINICAL DATA:  Right upper quadrant pain EXAM: ULTRASOUND ABDOMEN LIMITED RIGHT UPPER QUADRANT COMPARISON:  CT 02/25/2020 FINDINGS: Gallbladder: No gallstones or wall thickening visualized. No sonographic Murphy sign noted by sonographer. Common bile duct: Diameter: 5 mm Liver: No focal lesion identified. Heterogeneous echotexture with  diffusely increased parenchymal echogenicity. Portal vein is patent on color Doppler imaging with normal direction of blood flow towards the liver. Other: None. IMPRESSION: 1. Heterogeneous, echogenic appearance of the  liver. This is a nonspecific finding but is most commonly seen with fatty infiltration of the liver. Correlate with liver function tests. There are no obvious focal liver lesions. 2. Unremarkable sonographic appearance of the gallbladder. Electronically Signed   By: Davina Poke D.O.   On: 02/25/2020 12:35       Subjective: Pain much improved. No overnight events  Discharge Exam: Vitals:   02/26/20 0408 02/26/20 0643  BP: (!) 81/43 108/62  Pulse: 78 80  Resp: 16   Temp: (!) 97.3 F (36.3 C)   SpO2: 96%    Vitals:   02/25/20 1850 02/25/20 2004 02/26/20 0408 02/26/20 0643  BP: 115/60 110/63 (!) 81/43 108/62  Pulse: 70 77 78 80  Resp: 20 16 16    Temp: 98.3 F (36.8 C) 97.7 F (36.5 C) (!) 97.3 F (36.3 C)   TempSrc: Oral Oral Oral   SpO2: 99% 96% 96%   Weight:      Height:        General: NAD HEENT: moist mucosa  chest: clear, some reproducible pain CVS: NS1&S2  GI: soft, ND, NT Musculoskeletal: warm   The results of significant diagnostics from this hospitalization (including imaging, microbiology, ancillary and laboratory) are listed below for reference.     Microbiology: Recent Results (from the past 240 hour(s))  Respiratory Panel by RT PCR (Flu A&B, Covid) - Nasopharyngeal Swab     Status: None   Collection Time: 02/25/20  5:26 PM   Specimen: Nasopharyngeal Swab  Result Value Ref Range Status   SARS Coronavirus 2 by RT PCR NEGATIVE NEGATIVE Final    Comment: (NOTE) SARS-CoV-2 target nucleic acids are NOT DETECTED. The SARS-CoV-2 RNA is generally detectable in upper respiratoy specimens during the acute phase of infection. The lowest concentration of SARS-CoV-2 viral copies this assay can detect is 131 copies/mL. A negative result does not preclude SARS-Cov-2 infection and should not be used as the sole basis for treatment or other patient management decisions. A negative result may occur with  improper specimen collection/handling, submission of  specimen other than nasopharyngeal swab, presence of viral mutation(s) within the areas targeted by this assay, and inadequate number of viral copies (<131 copies/mL). A negative result must be combined with clinical observations, patient history, and epidemiological information. The expected result is Negative. Fact Sheet for Patients:  PinkCheek.be Fact Sheet for Healthcare Providers:  GravelBags.it This test is not yet ap proved or cleared by the [place] FDA and  has been authorized for detection and/or diagnosis of SARS-CoV-2 by FDA under an Emergency Use Authorization (EUA). This EUA will remain  in effect (meaning this test can be used) for the duration of the COVID-19 declaration under Section 564(b)(1) of the Act, 21 U.S.C. section 360bbb-3(b)(1), unless the authorization is terminated or revoked sooner.    Influenza A by PCR NEGATIVE NEGATIVE Final   Influenza B by PCR NEGATIVE NEGATIVE Final    Comment: (NOTE) The Xpert Xpress SARS-CoV-2/FLU/RSV assay is intended as an aid in  the diagnosis of influenza from Nasopharyngeal swab specimens and  should not be used as a sole basis for treatment. Nasal washings and  aspirates are unacceptable for Xpert Xpress SARS-CoV-2/FLU/RSV  testing. Fact Sheet for Patients: PinkCheek.be Fact Sheet for Healthcare Providers: GravelBags.it This test is not yet approved or cleared by  the [employer] and  has been authorized for detection and/or diagnosis of SARS-CoV-2 by  FDA under an Emergency Use Authorization (EUA). This EUA will remain  in effect (meaning this test can be used) for the duration of the  Covid-19 declaration under Section 564(b)(1) of the Act, 21  U.S.C. section 360bbb-3(b)(1), unless the authorization is  terminated or revoked. Performed at [facility], [address]  [place], [place] [address]      Labs: BNP (last 3 results) No results for input(s): BNP in the last 8760 hours. Basic Metabolic Panel: Recent Labs  Lab 02/25/20 1006  NA 138  K 3.6  CL 99  CO2 27  GLUCOSE 125*  BUN 25*  CREATININE 0.80  CALCIUM 9.2   Liver Function Tests: Recent Labs  Lab 02/25/20 1006  AST 54*  ALT 32  ALKPHOS 226*  BILITOT 0.6  PROT 7.8  ALBUMIN 4.3   Recent Labs  Lab 02/25/20 1006  LIPASE 46   No results for input(s): AMMONIA in the last 168 hours. CBC: Recent Labs  Lab 02/25/20 1006  WBC 12.9*  NEUTROABS 9.6*  HGB 10.2*  HCT 33.5*  MCV 79.2*  PLT 531*   Cardiac Enzymes: Recent Labs  Lab 02/25/20 1237  CKTOTAL 91   BNP: Invalid input(s): POCBNP CBG: No results for input(s): GLUCAP in the last 168 hours. D-Dimer No results for input(s): DDIMER in the last 72 hours. Hgb A1c Recent Labs    02/25/20 1237  HGBA1C 6.6*   Lipid Profile Recent Labs    02/26/20 0453  CHOL 156  HDL 42  LDLCALC 81  TRIG 164*  CHOLHDL 3.7   Thyroid function studies No results for input(s): TSH, T4TOTAL, T3FREE, THYROIDAB in the last 72 hours.  Invalid input(s): FREET3 Anemia work up No results for input(s): VITAMINB12, FOLATE, FERRITIN, TIBC, IRON, RETICCTPCT in the last 72 hours. Urinalysis    Component Value Date/Time   COLORURINE STRAW (A) 02/25/2020 1237   APPEARANCEUR CLEAR (A) 02/25/2020 1237   APPEARANCEUR HAZY 02/22/2013 1013   LABSPEC >1.046 (H) 02/25/2020 1237   LABSPEC 1.020 02/22/2013 1013   PHURINE 6.0 02/25/2020 1237   GLUCOSEU NEGATIVE 02/25/2020 1237   GLUCOSEU NEGATIVE 02/22/2013 1013   HGBUR NEGATIVE 02/25/2020 1237   BILIRUBINUR NEGATIVE 02/25/2020 1237   BILIRUBINUR NEGATIVE 02/22/2013 1013   KETONESUR NEGATIVE 02/25/2020 1237   PROTEINUR NEGATIVE 02/25/2020 1237   NITRITE NEGATIVE 02/25/2020 1237   LEUKOCYTESUR NEGATIVE 02/25/2020 1237   LEUKOCYTESUR NEGATIVE 02/22/2013 1013   Sepsis Labs Invalid input(s):  PROCALCITONIN,  WBC,  LACTICIDVEN Microbiology Recent Results (from the past 240 hour(s))  Respiratory Panel by RT PCR (Flu A&B, Covid) - Nasopharyngeal Swab     Status: None   Collection Time: 02/25/20  5:26 PM   Specimen: Nasopharyngeal Swab  Result Value Ref Range Status   SARS Coronavirus 2 by RT PCR NEGATIVE NEGATIVE Final    Comment: (NOTE) SARS-CoV-2 target nucleic acids are NOT DETECTED. The SARS-CoV-2 RNA is generally detectable in upper respiratoy specimens during the acute phase of infection. The lowest concentration of SARS-CoV-2 viral copies this assay can detect is 131 copies/mL. A negative result does not preclude SARS-Cov-2 infection and should not be used as the sole basis for treatment or other patient management decisions. A negative result may occur with  improper specimen collection/handling, submission of specimen other than nasopharyngeal swab, presence of viral mutation(s) within the areas targeted by this assay, and inadequate number of viral copies (<  131 copies/mL). A negative result must be combined with clinical observations, patient history, and epidemiological information. The expected result is Negative. Fact Sheet for Patients:  https://www.moore.com/ Fact Sheet for Healthcare Providers:  https://www.young.biz/ This test is not yet ap proved or cleared by the [place] FDA and  has been authorized for detection and/or diagnosis of SARS-CoV-2 by FDA under an Emergency Use Authorization (EUA). This EUA will remain  in effect (meaning this test can be used) for the duration of the COVID-19 declaration under Section 564(b)(1) of the Act, 21 U.S.C. section 360bbb-3(b)(1), unless the authorization is terminated or revoked sooner.    Influenza A by PCR NEGATIVE NEGATIVE Final   Influenza B by PCR NEGATIVE NEGATIVE Final    Comment: (NOTE) The Xpert Xpress SARS-CoV-2/FLU/RSV assay is intended as an aid in  the  diagnosis of influenza from Nasopharyngeal swab specimens and  should not be used as a sole basis for treatment. Nasal washings and  aspirates are unacceptable for Xpert Xpress SARS-CoV-2/FLU/RSV  testing. Fact Sheet for Patients: https://www.moore.com/ Fact Sheet for Healthcare Providers: https://www.young.biz/ This test is not yet approved or cleared by the [place] FDA and  has been authorized for detection and/or diagnosis of SARS-CoV-2 by  FDA under an Emergency Use Authorization (EUA). This EUA will remain  in effect (meaning this test can be used) for the duration of the  Covid-19 declaration under Section 564(b)(1) of the Act, 21  U.S.C. section 360bbb-3(b)(1), unless the authorization is  terminated or revoked. Performed at [facility], [address], [place], [place] [address]      Time coordinating discharge: 25 minutes  SIGNED:   Eddie North, MD  [facility] 02/26/2020, 8:58 AM Pager   If 7PM-7AM, please contact night-coverage [email or web address] Password [email or web address]

## 2020-02-26 NOTE — Plan of Care (Signed)
Continuing with plan of care. 

## 2020-11-20 IMAGING — MG MM DIGITAL SCREENING BILAT W/ TOMO W/ CAD
8 series · 8 of 24 positions shown · non-contrast
Comparison: Previous exam(s).

CLINICAL DATA: Screening.

EXAM:
DIGITAL SCREENING BILATERAL MAMMOGRAM WITH TOMO AND CAD

[L MLO synth-2D]
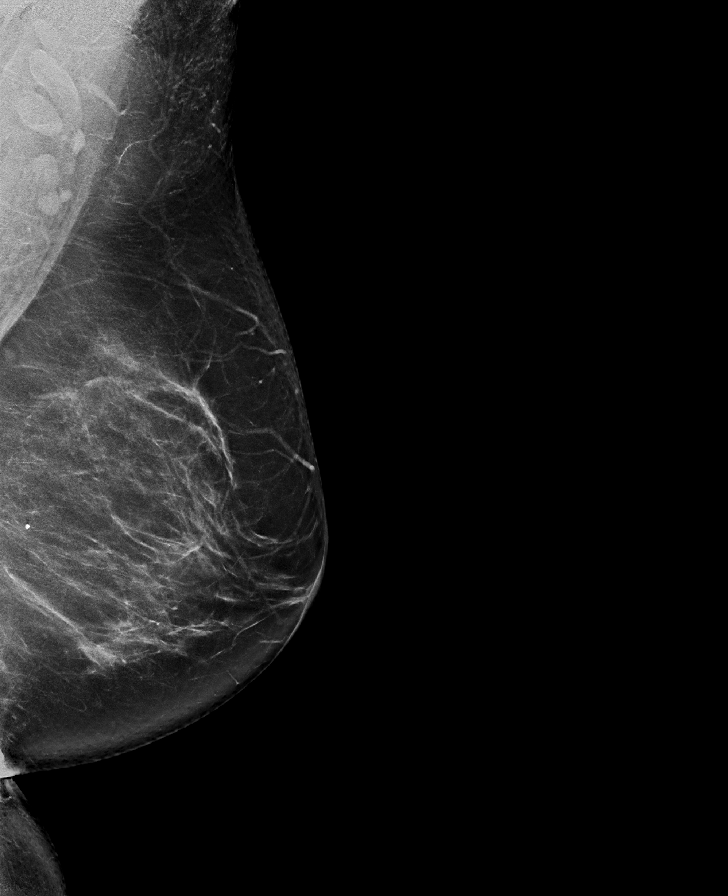

[R MLO synth-2D]
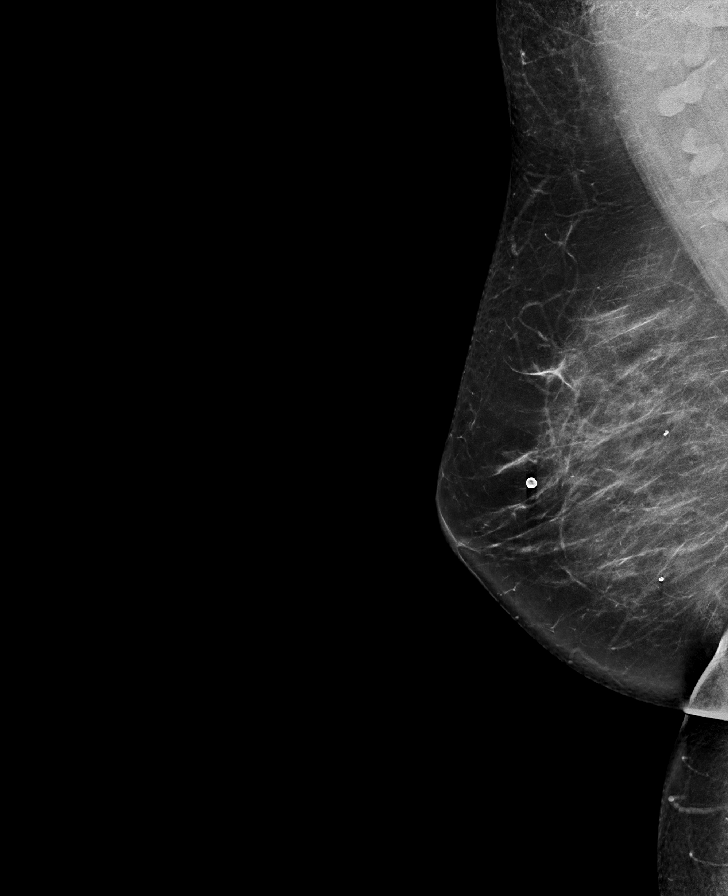

[R CC synth-2D]
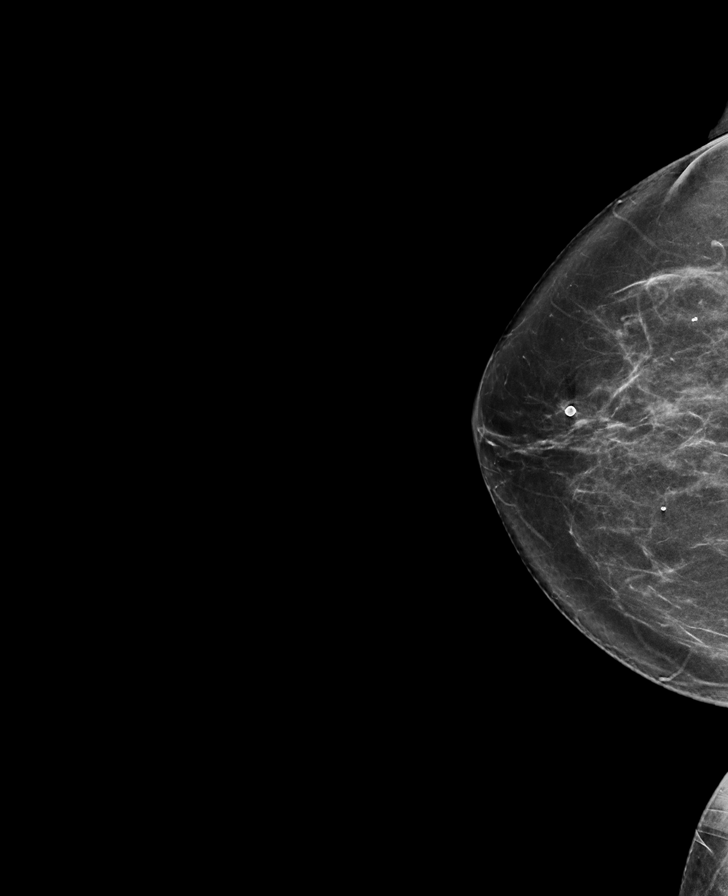

[L CC synth-2D]
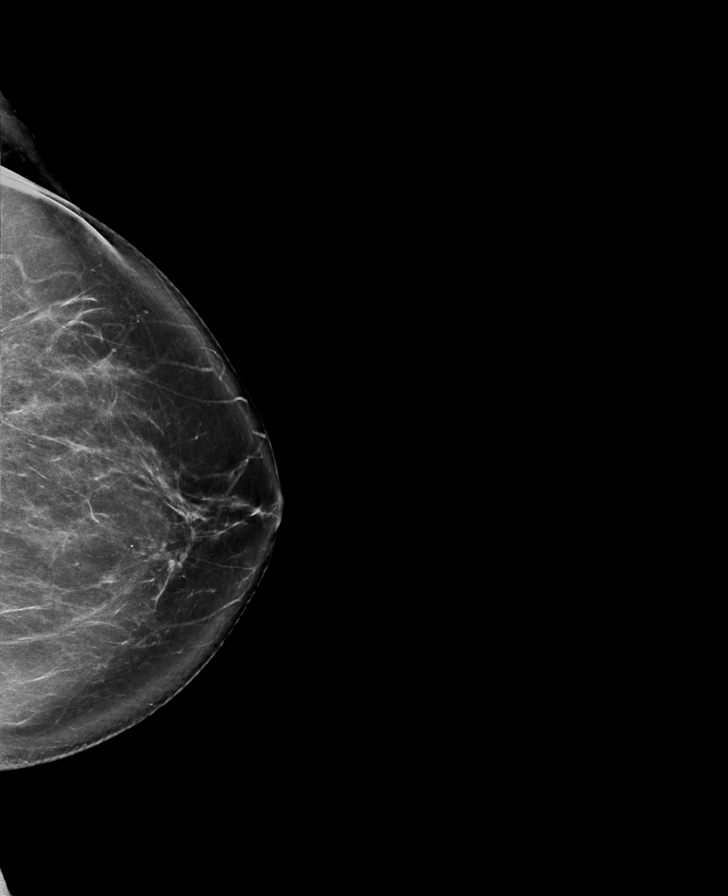

[R MLO tomo · tomo slice 41/80.0]
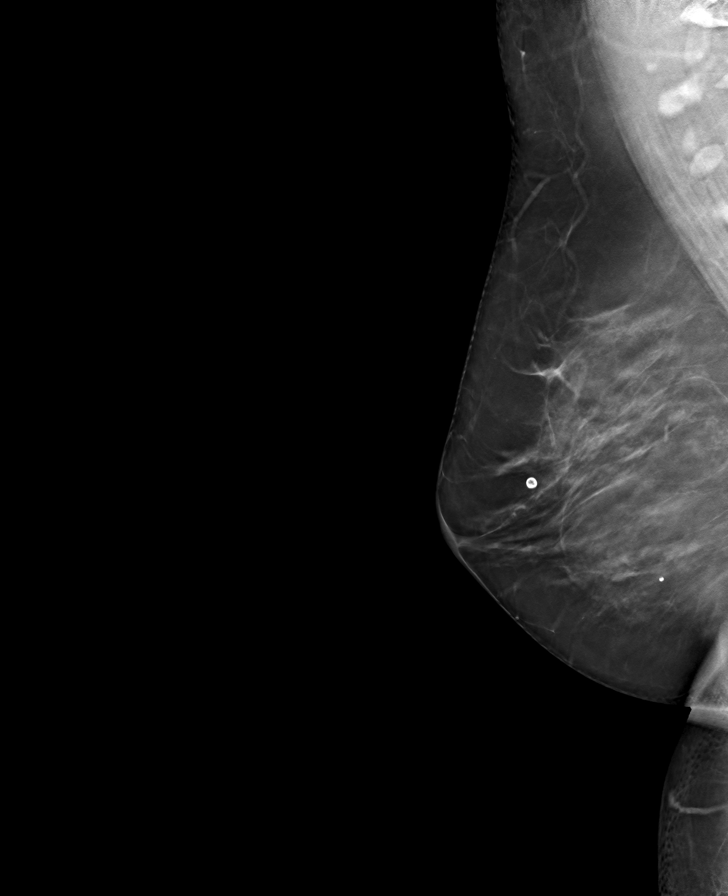

[R CC tomo · tomo slice 37/72.0]
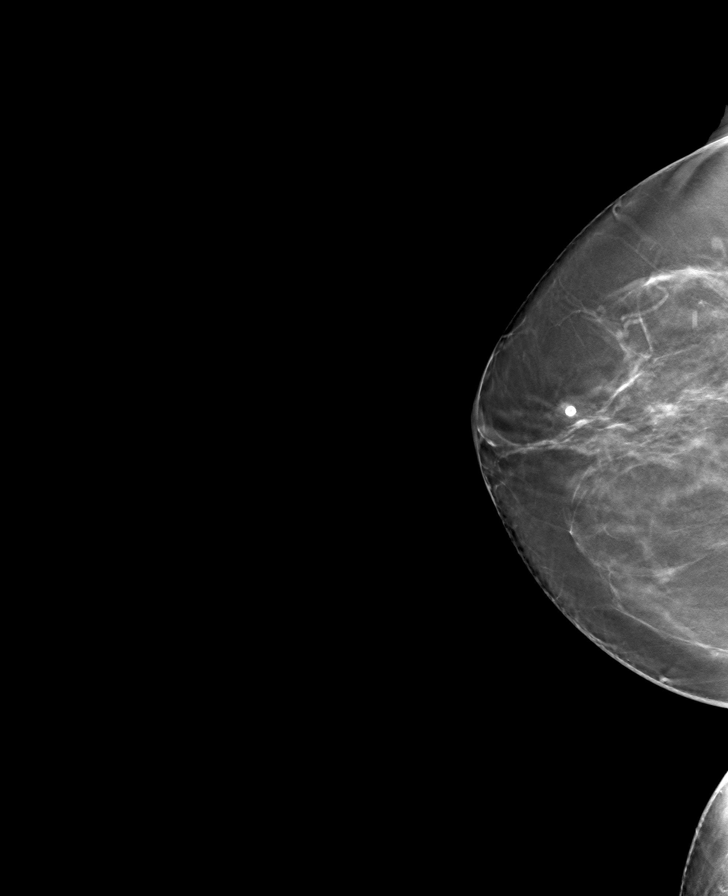

[L CC tomo · tomo slice 44/87.0]
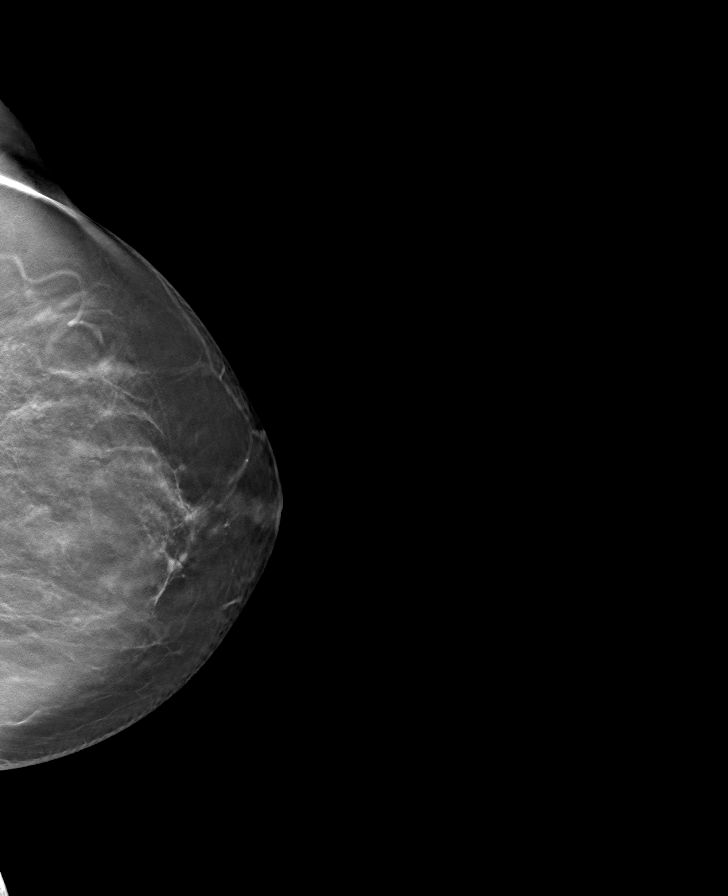

[L MLO tomo · tomo slice 43/86.0]
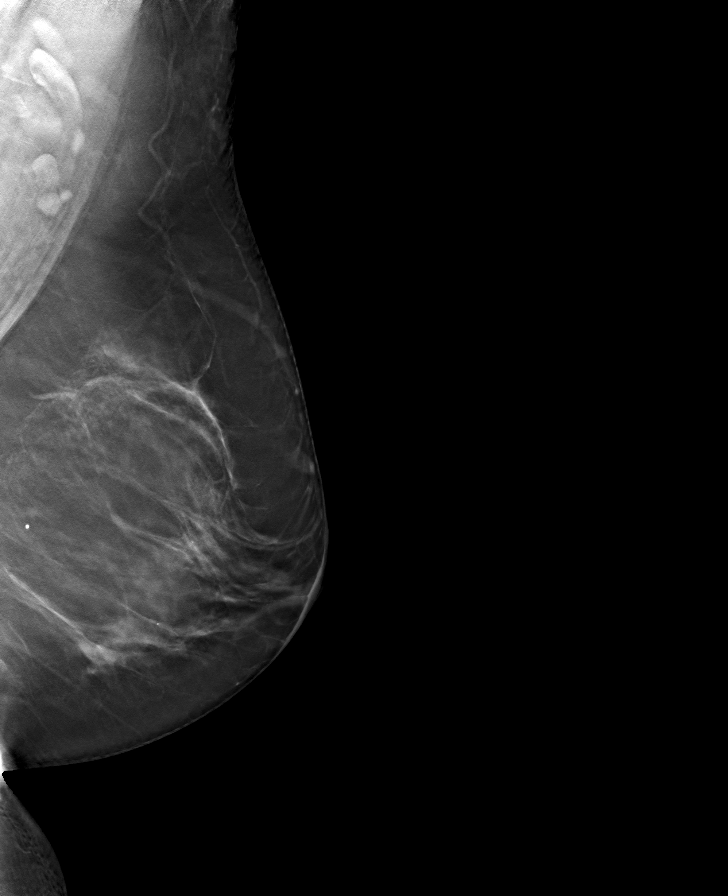

[8 of 24 positions shown; findings below may reference images not displayed]

ACR Breast Density Category b: There are scattered areas of
fibroglandular density.
FINDINGS: There are no findings suspicious for malignancy. Images were
processed with CAD.
IMPRESSION: No mammographic evidence of malignancy. A result letter of this
screening mammogram will be mailed directly to the patient.

RECOMMENDATION:
Screening mammogram in one year. (Code:[id number])

BI-RADS CATEGORY  1: Negative.

## 2021-01-29 ENCOUNTER — Other Ambulatory Visit: Payer: Self-pay | Admitting: Family Medicine

## 2021-01-29 DIAGNOSIS — Z1231 Encounter for screening mammogram for malignant neoplasm of breast: Secondary | ICD-10-CM

## 2021-02-22 ENCOUNTER — Encounter: Payer: Self-pay | Admitting: *Deleted

## 2021-08-17 ENCOUNTER — Other Ambulatory Visit: Payer: Self-pay

## 2021-08-17 ENCOUNTER — Encounter: Payer: Self-pay | Admitting: Emergency Medicine

## 2021-08-17 ENCOUNTER — Emergency Department: Payer: 59

## 2021-08-17 ENCOUNTER — Emergency Department
Admission: EM | Admit: 2021-08-17 | Discharge: 2021-08-17 | Disposition: A | Discharge status: Home / Self Care | Payer: 59 | Attending: Emergency Medicine | Admitting: Emergency Medicine

## 2021-08-17 DIAGNOSIS — U071 COVID-19: Secondary | ICD-10-CM | POA: Insufficient documentation

## 2021-08-17 DIAGNOSIS — I10 Essential (primary) hypertension: Secondary | ICD-10-CM | POA: Insufficient documentation

## 2021-08-17 DIAGNOSIS — F17200 Nicotine dependence, unspecified, uncomplicated: Secondary | ICD-10-CM | POA: Diagnosis not present

## 2021-08-17 DIAGNOSIS — R221 Localized swelling, mass and lump, neck: Secondary | ICD-10-CM | POA: Diagnosis not present

## 2021-08-17 DIAGNOSIS — R519 Headache, unspecified: Secondary | ICD-10-CM

## 2021-08-17 DIAGNOSIS — H5712 Ocular pain, left eye: Secondary | ICD-10-CM

## 2021-08-17 DIAGNOSIS — Z79899 Other long term (current) drug therapy: Secondary | ICD-10-CM | POA: Diagnosis not present

## 2021-08-17 DIAGNOSIS — E119 Type 2 diabetes mellitus without complications: Secondary | ICD-10-CM | POA: Insufficient documentation

## 2021-08-17 LAB — COMPREHENSIVE METABOLIC PANEL
ALT: 17 U/L (ref 0–44)
AST: 22 U/L (ref 15–41)
Albumin: 4.3 g/dL (ref 3.5–5.0)
Alkaline Phosphatase: 120 U/L (ref 38–126)
Anion gap: 11 (ref 5–15)
BUN: 24 mg/dL — ABNORMAL HIGH (ref 6–20)
CO2: 27 mmol/L (ref 22–32)
Calcium: 9.4 mg/dL (ref 8.9–10.3)
Chloride: 101 mmol/L (ref 98–111)
Creatinine, Ser: 0.66 mg/dL (ref 0.44–1.00)
GFR, Estimated: >60 mL/min (ref >60)
Glucose, Bld: 94 mg/dL (ref 70–99)
Potassium: 4.2 mmol/L (ref 3.5–5.1)
Sodium: 139 mmol/L (ref 135–145)
Total Bilirubin: 0.6 mg/dL (ref 0.3–1.2)
Total Protein: 7.6 g/dL (ref 6.5–8.1)

## 2021-08-17 LAB — CBC
HCT: 44.4 % (ref 36.0–46.0)
Hemoglobin: 15.2 g/dL — ABNORMAL HIGH (ref 12.0–15.0)
MCH: 29.9 pg (ref 26.0–34.0)
MCHC: 34.2 g/dL (ref 30.0–36.0)
MCV: 87.4 fL (ref 80.0–100.0)
Platelets: 267 10*3/uL (ref 150–400)
RBC: 5.08 MIL/uL (ref 3.87–5.11)
RDW: 13.7 % (ref 11.5–15.5)
WBC: 7.5 10*3/uL (ref 4.0–10.5)
nRBC: 0 % (ref 0.0–0.2)

## 2021-08-17 MED ORDER — BUTALBITAL-APAP-CAFFEINE 50-325-40 MG PO TABS: 1.0000 | ORAL_TABLET | Freq: Four times a day (QID) | ORAL | 0 refills | Status: AC | PRN

## 2021-08-17 MED ORDER — ONDANSETRON HCL 4 MG/2ML IJ SOLN
4.0000 mg | Freq: Once | INTRAMUSCULAR | Status: AC
  Administered 2021-08-17: 4 mg via INTRAVENOUS
  Filled 2021-08-17: qty 2

## 2021-08-17 MED ORDER — METOCLOPRAMIDE HCL 5 MG/ML IJ SOLN
20.0000 mg | Freq: Once | INTRAVENOUS | Status: AC
  Administered 2021-08-17: 20 mg via INTRAVENOUS
  Filled 2021-08-17: qty 4

## 2021-08-17 MED ORDER — DIPHENHYDRAMINE HCL 50 MG/ML IJ SOLN
25.0000 mg | Freq: Once | INTRAMUSCULAR | Status: AC
  Administered 2021-08-17: 25 mg via INTRAVENOUS
  Filled 2021-08-17: qty 1

## 2021-08-17 MED ORDER — IOHEXOL 350 MG/ML SOLN
75.0000 mL | Freq: Once | INTRAVENOUS | Status: AC | PRN
  Administered 2021-08-17: 75 mL via INTRAVENOUS

## 2021-08-17 MED ORDER — SODIUM CHLORIDE 0.9 % IV SOLN
1000.0000 mL | Freq: Once | INTRAVENOUS | Status: AC
  Administered 2021-08-17: 1000 mL via INTRAVENOUS

## 2021-08-17 MED ORDER — KETOROLAC TROMETHAMINE 30 MG/ML IJ SOLN
30.0000 mg | Freq: Once | INTRAMUSCULAR | Status: AC
  Administered 2021-08-17: 30 mg via INTRAVENOUS
  Filled 2021-08-17: qty 1

## 2021-08-17 NOTE — ED Triage Notes (Addendum)
Pt comes into the ED via POV c/o left eye pain and left side left side neck swelling.  Pt was diagnosed with COVID on Saturday and then the swelling started on Tuesday.  Pt given pain meds, eye drops, and her normal medications, but no antibiotics at this time.  Pt still able to swallow and talk at this time.

## 2021-08-17 NOTE — ED Provider Notes (Signed)
[facility] Emergency Department Provider Note   ____________________________________________    I have reviewed the triage vital signs and the nursing notes.   HISTORY  Chief Complaint Eye Pain and Facial Swelling     HPI Alexandria Larson is a 55 y.o. female who presents with complaints of severe headache, photosensitivity as well as left-sided neck swelling and tightness in her throat.  Patient reports she was diagnosed with COVID-19 on Saturday.  She has had severe headaches daily.  Actually went to [facility] emergency department for headaches, reports was given some medication that has helped somewhat, however they have worsened again and now she has swelling in the left side of her neck and a tightness when she swallows.  She denies sore throat.  Fevers have improved.  No difficulty breathing  Past Medical History:  Diagnosis Date   Hypertension     Patient Active Problem List   Diagnosis Date Noted   Type 2 diabetes mellitus without complication (HCC) 02/26/2020   Chest pain at rest 02/25/2020   Essential hypertension 02/25/2020   Tobacco abuse 02/25/2020    Past Surgical History:  Procedure Laterality Date   ABDOMINAL HYSTERECTOMY     BREAST EXCISIONAL BIOPSY Right 2005   benign   TONSILLECTOMY      Prior to Admission medications   Medication Sig Start Date End Date Taking? Authorizing Provider  butalbital-acetaminophen-caffeine (FIORICET) 50-325-40 MG tablet Take 1-2 tablets by mouth every 6 (six) hours as needed for headache. 08/17/21 08/17/22 Yes Jene Every, MD  atorvastatin (LIPITOR) 20 MG tablet Take 20 mg by mouth daily. 02/11/20   [provider]  furosemide (LASIX) 40 MG tablet Take 40 mg by mouth 2 (two) times daily. 02/25/20   [provider]  lisinopril-hydrochlorothiazide (ZESTORETIC) 10-12.5 MG tablet Take 1 tablet by mouth daily. 02/11/20   [provider]     Allergies Erythromycin and Keflex  [cephalexin]  History reviewed. No pertinent family history.  Social History Social History   Tobacco Use   Smoking status: Every Day   Smokeless tobacco: Never  Substance Use Topics   Alcohol use: Yes    Review of Systems  Constitutional: No fever/chills Eyes: No visual changes.  ENT: As above, no intraoral pain Cardiovascular: Denies chest pain. Respiratory: Denies shortness of breath. Gastrointestinal: No abdominal pain.   Genitourinary: Negative for dysuria. Musculoskeletal: Negative for back pain. Skin: Negative for rash. Neurological: Positive headache, no neurodeficit   ____________________________________________   PHYSICAL EXAM:  VITAL SIGNS: ED Triage Vitals  Enc Vitals Group     BP 08/17/21 0740 121/61     Pulse Rate 08/17/21 0740 63     Resp 08/17/21 0740 16     Temp 08/17/21 0740 98 F (36.7 C)     Temp Source 08/17/21 0740 Oral     SpO2 08/17/21 0740 100 %     Weight 08/17/21 0736 74.8 kg (164 lb 14.5 oz)     Height 08/17/21 0736 1.651 m (5\' 5" )     Head Circumference --      Peak Flow --      Pain Score 08/17/21 0735 10     Pain Loc --      Pain Edu? --      Excl. in GC? --     Constitutional: Alert and oriented. No acute distress. Pleasant and interactive Eyes: Left eye: Erythematous conjunctive eye, suspect chemosis, pupil is minimally reactive, highly photosensitive, patient holds eye close, she is  able to open it Head: Atraumatic. Nose: No congestion/rhinnorhea. Mouth/Throat: Mucous membranes are moist.  No intraoral swelling, floor of mouth is soft, poor dentition, normal pharynx, normal uvula.  Left anterior neck mildly swollen, question lymphadenopathy Neck:  Painless ROM Cardiovascular: Normal rate, regular rhythm.   Good peripheral circulation. Respiratory: Normal respiratory effort.  No retractions. Lungs CTAB.  Musculoskeletal: No lower extremity tenderness nor edema.  Warm and well perfused Neurologic:  Normal speech and  language. No gross focal neurologic deficits are appreciated.  Cranial nerves II through XII normal.  Normal strength in all extremities Skin:  Skin is warm, dry and intact. No rash noted. Psychiatric: Mood and affect are normal. Speech and behavior are normal.  ____________________________________________   LABS (all labs ordered are listed, but only abnormal results are displayed)  Labs Reviewed  CBC - Abnormal; Notable for the following components:      Result Value   Hemoglobin 15.2 (*)    All other components within normal limits  COMPREHENSIVE METABOLIC PANEL - Abnormal; Notable for the following components:   BUN 24 (*)    All other components within normal limits   ____________________________________________  EKG  None ____________________________________________  RADIOLOGY  CT head, CT soft tissue neck reviewed by me, discussed the need for outpatient reimaging after COVID-19 illness is improved to reevaluate lymph nodes with the patient ____________________________________________   PROCEDURES  Procedure(s) performed: No  Procedures   Critical Care performed: No ____________________________________________   INITIAL IMPRESSION / ASSESSMENT AND PLAN / ED COURSE  Pertinent labs & imaging results that were available during my care of the patient were reviewed by me and considered in my medical decision making (see chart for details).   Patient presents with symptoms as above.  Headache is a frequent symptom of COVID-19 however given this is her second presentation for headache to the emergency department with photosensitivity and no significant history of migraines will obtain CT imaging.  She does have some swelling to the left neck and description of tightness in her throat, will obtain CT soft tissue neck, labs, will treat with migraine cocktail   ----------------------------------------- 11:49 AM on  08/17/2021 ----------------------------------------- Consulted Dr. Brooke Dare of ophthalmology, he will see the patient in his office in 1 hour for further exam of the left eye    ____________________________________________   FINAL CLINICAL IMPRESSION(S) / ED DIAGNOSES  Final diagnoses:  Left eye pain  COVID-19  Acute nonintractable headache, unspecified headache type        Note:  This document was prepared using Dragon voice recognition software and may include unintentional dictation errors.    Jene Every, MD 08/17/21 1150

## 2021-12-25 ENCOUNTER — Other Ambulatory Visit: Payer: Self-pay | Admitting: Family Medicine

## 2021-12-25 DIAGNOSIS — R221 Localized swelling, mass and lump, neck: Secondary | ICD-10-CM

## 2022-01-03 ENCOUNTER — Ambulatory Visit: Admission: RE | Admit: 2022-01-03 | Payer: 59 | Source: Ambulatory Visit

## 2022-08-26 ENCOUNTER — Other Ambulatory Visit: Payer: Self-pay | Admitting: Family Medicine

## 2022-08-26 DIAGNOSIS — Z1231 Encounter for screening mammogram for malignant neoplasm of breast: Secondary | ICD-10-CM

## 2022-11-21 ENCOUNTER — Emergency Department: Admission: EM | Admit: 2022-11-21 | Discharge: 2022-11-21 | Discharge status: Against Medical Advice | Payer: 59

## 2023-08-20 ENCOUNTER — Other Ambulatory Visit: Payer: Self-pay | Admitting: Family Medicine

## 2023-08-20 DIAGNOSIS — Z1231 Encounter for screening mammogram for malignant neoplasm of breast: Secondary | ICD-10-CM

## 2024-06-24 ENCOUNTER — Other Ambulatory Visit: Payer: Self-pay | Admitting: Family Medicine

## 2024-06-24 DIAGNOSIS — Z1231 Encounter for screening mammogram for malignant neoplasm of breast: Secondary | ICD-10-CM

## 2024-07-13 ENCOUNTER — Encounter
